# Patient Record
Sex: Female | Born: 1980 | Race: White | Hispanic: No | Smoking: Former smoker
Health system: Southern US, Community
[De-identification: ages and names within clinical notes are randomized; demographics above are authoritative.]

## PROBLEM LIST (undated history)

## (undated) DIAGNOSIS — N200 Calculus of kidney: Secondary | ICD-10-CM

## (undated) DIAGNOSIS — I341 Nonrheumatic mitral (valve) prolapse: Secondary | ICD-10-CM

## (undated) DIAGNOSIS — N879 Dysplasia of cervix uteri, unspecified: Secondary | ICD-10-CM

## (undated) DIAGNOSIS — Z87440 Personal history of urinary (tract) infections: Secondary | ICD-10-CM

## (undated) DIAGNOSIS — N159 Renal tubulo-interstitial disease, unspecified: Secondary | ICD-10-CM

## (undated) DIAGNOSIS — N12 Tubulo-interstitial nephritis, not specified as acute or chronic: Secondary | ICD-10-CM

## (undated) DIAGNOSIS — R001 Bradycardia, unspecified: Secondary | ICD-10-CM

## (undated) DIAGNOSIS — D649 Anemia, unspecified: Secondary | ICD-10-CM

## (undated) DIAGNOSIS — F32A Depression, unspecified: Secondary | ICD-10-CM

## (undated) DIAGNOSIS — Z3490 Encounter for supervision of normal pregnancy, unspecified, unspecified trimester: Secondary | ICD-10-CM

## (undated) DIAGNOSIS — I2699 Other pulmonary embolism without acute cor pulmonale: Secondary | ICD-10-CM

## (undated) DIAGNOSIS — F419 Anxiety disorder, unspecified: Secondary | ICD-10-CM

## (undated) DIAGNOSIS — R87629 Unspecified abnormal cytological findings in specimens from vagina: Secondary | ICD-10-CM

## (undated) DIAGNOSIS — R112 Nausea with vomiting, unspecified: Secondary | ICD-10-CM

## (undated) DIAGNOSIS — I10 Essential (primary) hypertension: Secondary | ICD-10-CM

## (undated) DIAGNOSIS — K219 Gastro-esophageal reflux disease without esophagitis: Secondary | ICD-10-CM

## (undated) DIAGNOSIS — Z9889 Other specified postprocedural states: Secondary | ICD-10-CM

## (undated) DIAGNOSIS — R569 Unspecified convulsions: Secondary | ICD-10-CM

## (undated) DIAGNOSIS — F329 Major depressive disorder, single episode, unspecified: Secondary | ICD-10-CM

## (undated) DIAGNOSIS — E282 Polycystic ovarian syndrome: Secondary | ICD-10-CM

## (undated) DIAGNOSIS — R51 Headache: Secondary | ICD-10-CM

## (undated) HISTORY — DX: Personal history of urinary (tract) infections: Z87.440

## (undated) HISTORY — PX: DIAGNOSTIC LAPAROSCOPY: SUR761

## (undated) HISTORY — DX: Calculus of kidney: N20.0

## (undated) HISTORY — DX: Unspecified abnormal cytological findings in specimens from vagina: R87.629

## (undated) HISTORY — DX: Encounter for supervision of normal pregnancy, unspecified, unspecified trimester: Z34.90

## (undated) HISTORY — DX: Tubulo-interstitial nephritis, not specified as acute or chronic: N12

## (undated) HISTORY — DX: Bradycardia, unspecified: R00.1

---
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
## 2004-06-17 ENCOUNTER — Emergency Department (HOSPITAL_COMMUNITY): Admission: EM | Admit: 2004-06-17 | Discharge: 2004-06-18 | Payer: Self-pay

## 2004-10-28 ENCOUNTER — Ambulatory Visit: Payer: Self-pay | Admitting: Family Medicine

## 2007-09-15 ENCOUNTER — Encounter: Payer: Self-pay | Admitting: Family Medicine

## 2010-10-14 NOTE — Letter (Signed)
Summary: Historic Patient File  Historic Patient File   Imported By: Lind Guest 06/13/2010 16:18:44  _____________________________________________________________________  External Attachment:    Type:   Image     Comment:   External Document

## 2013-05-15 DIAGNOSIS — R569 Unspecified convulsions: Secondary | ICD-10-CM

## 2013-05-15 HISTORY — DX: Unspecified convulsions: R56.9

## 2013-09-03 ENCOUNTER — Encounter (HOSPITAL_COMMUNITY): Payer: Self-pay | Admitting: Emergency Medicine

## 2013-09-03 ENCOUNTER — Emergency Department (HOSPITAL_COMMUNITY)
Admission: EM | Admit: 2013-09-03 | Discharge: 2013-09-03 | Disposition: A | Discharge status: Home / Self Care | Payer: Self-pay | Attending: Emergency Medicine | Admitting: Emergency Medicine

## 2013-09-03 ENCOUNTER — Emergency Department (HOSPITAL_COMMUNITY): Payer: Self-pay

## 2013-09-03 DIAGNOSIS — Y929 Unspecified place or not applicable: Secondary | ICD-10-CM | POA: Insufficient documentation

## 2013-09-03 DIAGNOSIS — Y939 Activity, unspecified: Secondary | ICD-10-CM | POA: Insufficient documentation

## 2013-09-03 DIAGNOSIS — F329 Major depressive disorder, single episode, unspecified: Secondary | ICD-10-CM | POA: Insufficient documentation

## 2013-09-03 DIAGNOSIS — I059 Rheumatic mitral valve disease, unspecified: Secondary | ICD-10-CM | POA: Insufficient documentation

## 2013-09-03 DIAGNOSIS — IMO0002 Reserved for concepts with insufficient information to code with codable children: Secondary | ICD-10-CM | POA: Insufficient documentation

## 2013-09-03 DIAGNOSIS — Z880 Allergy status to penicillin: Secondary | ICD-10-CM | POA: Insufficient documentation

## 2013-09-03 DIAGNOSIS — Z8669 Personal history of other diseases of the nervous system and sense organs: Secondary | ICD-10-CM | POA: Insufficient documentation

## 2013-09-03 DIAGNOSIS — F17200 Nicotine dependence, unspecified, uncomplicated: Secondary | ICD-10-CM | POA: Insufficient documentation

## 2013-09-03 DIAGNOSIS — R569 Unspecified convulsions: Secondary | ICD-10-CM | POA: Insufficient documentation

## 2013-09-03 DIAGNOSIS — S82841A Displaced bimalleolar fracture of right lower leg, initial encounter for closed fracture: Secondary | ICD-10-CM

## 2013-09-03 DIAGNOSIS — T07XXXA Unspecified multiple injuries, initial encounter: Secondary | ICD-10-CM | POA: Insufficient documentation

## 2013-09-03 DIAGNOSIS — S82843A Displaced bimalleolar fracture of unspecified lower leg, initial encounter for closed fracture: Secondary | ICD-10-CM | POA: Insufficient documentation

## 2013-09-03 DIAGNOSIS — F411 Generalized anxiety disorder: Secondary | ICD-10-CM | POA: Insufficient documentation

## 2013-09-03 DIAGNOSIS — X500XXA Overexertion from strenuous movement or load, initial encounter: Secondary | ICD-10-CM | POA: Insufficient documentation

## 2013-09-03 DIAGNOSIS — Z8742 Personal history of other diseases of the female genital tract: Secondary | ICD-10-CM | POA: Insufficient documentation

## 2013-09-03 DIAGNOSIS — D649 Anemia, unspecified: Secondary | ICD-10-CM | POA: Insufficient documentation

## 2013-09-03 DIAGNOSIS — F3289 Other specified depressive episodes: Secondary | ICD-10-CM | POA: Insufficient documentation

## 2013-09-03 DIAGNOSIS — Z87440 Personal history of urinary (tract) infections: Secondary | ICD-10-CM | POA: Insufficient documentation

## 2013-09-03 HISTORY — DX: Nonrheumatic mitral (valve) prolapse: I34.1

## 2013-09-03 MED ORDER — OXYCODONE-ACETAMINOPHEN 5-325 MG PO TABS: 2.0000 | ORAL_TABLET | ORAL | Status: DC | PRN

## 2013-09-03 MED ORDER — MORPHINE SULFATE 4 MG/ML IJ SOLN
4.0000 mg | Freq: Once | INTRAMUSCULAR | Status: AC
  Administered 2013-09-03: 4 mg via INTRAMUSCULAR
  Filled 2013-09-03: qty 1

## 2013-09-03 MED ORDER — MORPHINE SULFATE 4 MG/ML IJ SOLN
4.0000 mg | Freq: Once | INTRAMUSCULAR | Status: AC
  Administered 2013-09-03: 4 mg via INTRAVENOUS
  Filled 2013-09-03: qty 1
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
## 2013-09-03 NOTE — ED Notes (Signed)
Ortho paged. 

## 2013-09-03 NOTE — Progress Notes (Signed)
Orthopedic Tech Progress Note Patient Details:  Lauren Durham May 24, 1981 [id number]  Ortho Devices Type of Ortho Device: Stirrup splint;Post (short) splint   Haskell Flirt 09/03/2013, 2:19 AM

## 2013-09-04 ENCOUNTER — Other Ambulatory Visit (HOSPITAL_COMMUNITY): Payer: Self-pay | Admitting: Orthopaedic Surgery

## 2013-09-05 ENCOUNTER — Other Ambulatory Visit (HOSPITAL_COMMUNITY): Payer: Self-pay | Admitting: *Deleted

## 2013-09-05 ENCOUNTER — Encounter (HOSPITAL_COMMUNITY): Payer: Self-pay | Admitting: *Deleted

## 2013-09-05 NOTE — Progress Notes (Signed)
Pt states she thinks she may have injured her chest when she fell the other day. States chest is very sore between her breasts. States she did mention it to her surgeon and she stated that he said he would examine her chest when she comes in for her surgery

## 2013-09-07 MED ORDER — CLINDAMYCIN PHOSPHATE 900 MG/50ML IV SOLN
900.0000 mg | INTRAVENOUS | Status: AC
  Administered 2013-09-08: 900 mg via INTRAVENOUS
  Filled 2013-09-07 (×2): qty 50

## 2013-09-08 ENCOUNTER — Encounter (HOSPITAL_COMMUNITY): Payer: Self-pay | Admitting: *Deleted

## 2013-09-08 ENCOUNTER — Inpatient Hospital Stay (HOSPITAL_COMMUNITY): Payer: Medicaid Other | Admitting: Anesthesiology

## 2013-09-08 ENCOUNTER — Encounter (HOSPITAL_COMMUNITY)
Admission: RE | Disposition: A | Discharge status: Home / Self Care | Payer: Self-pay | Source: Ambulatory Visit | Attending: Orthopaedic Surgery

## 2013-09-08 ENCOUNTER — Inpatient Hospital Stay (HOSPITAL_COMMUNITY): Payer: Medicaid Other

## 2013-09-08 ENCOUNTER — Observation Stay (HOSPITAL_COMMUNITY)
Admission: RE | Admit: 2013-09-08 | Discharge: 2013-09-09 | Disposition: A | Discharge status: Home / Self Care | Payer: Self-pay | Source: Ambulatory Visit | Attending: Orthopaedic Surgery | Admitting: Orthopaedic Surgery

## 2013-09-08 ENCOUNTER — Encounter (HOSPITAL_COMMUNITY): Payer: Self-pay | Admitting: Anesthesiology

## 2013-09-08 DIAGNOSIS — E282 Polycystic ovarian syndrome: Secondary | ICD-10-CM | POA: Insufficient documentation

## 2013-09-08 DIAGNOSIS — R55 Syncope and collapse: Secondary | ICD-10-CM

## 2013-09-08 DIAGNOSIS — R0609 Other forms of dyspnea: Secondary | ICD-10-CM | POA: Insufficient documentation

## 2013-09-08 DIAGNOSIS — R079 Chest pain, unspecified: Secondary | ICD-10-CM | POA: Insufficient documentation

## 2013-09-08 DIAGNOSIS — W19XXXA Unspecified fall, initial encounter: Secondary | ICD-10-CM | POA: Insufficient documentation

## 2013-09-08 DIAGNOSIS — F17200 Nicotine dependence, unspecified, uncomplicated: Secondary | ICD-10-CM | POA: Insufficient documentation

## 2013-09-08 DIAGNOSIS — S82891A Other fracture of right lower leg, initial encounter for closed fracture: Secondary | ICD-10-CM

## 2013-09-08 DIAGNOSIS — I059 Rheumatic mitral valve disease, unspecified: Secondary | ICD-10-CM | POA: Insufficient documentation

## 2013-09-08 DIAGNOSIS — I341 Nonrheumatic mitral (valve) prolapse: Secondary | ICD-10-CM

## 2013-09-08 DIAGNOSIS — Z7982 Long term (current) use of aspirin: Secondary | ICD-10-CM | POA: Insufficient documentation

## 2013-09-08 DIAGNOSIS — S82843A Displaced bimalleolar fracture of unspecified lower leg, initial encounter for closed fracture: Principal | ICD-10-CM | POA: Insufficient documentation

## 2013-09-08 DIAGNOSIS — R0989 Other specified symptoms and signs involving the circulatory and respiratory systems: Secondary | ICD-10-CM | POA: Insufficient documentation

## 2013-09-08 HISTORY — DX: Polycystic ovarian syndrome: E28.2

## 2013-09-08 HISTORY — PX: ORIF ANKLE FRACTURE: SHX5408

## 2013-09-08 HISTORY — DX: Unspecified convulsions: R56.9

## 2013-09-08 HISTORY — DX: Anxiety disorder, unspecified: F41.9

## 2013-09-08 HISTORY — DX: Headache: R51

## 2013-09-08 HISTORY — DX: Renal tubulo-interstitial disease, unspecified: N15.9

## 2013-09-08 HISTORY — DX: Anemia, unspecified: D64.9

## 2013-09-08 HISTORY — DX: Dysplasia of cervix uteri, unspecified: N87.9

## 2013-09-08 HISTORY — DX: Depression, unspecified: F32.A

## 2013-09-08 HISTORY — DX: Major depressive disorder, single episode, unspecified: F32.9

## 2013-09-08 LAB — CBC
HCT: 36 % (ref 36.0–46.0)
MCH: 32.6 pg (ref 26.0–34.0)
MCHC: 34.2 g/dL (ref 30.0–36.0)
MCV: 95.5 fL (ref 78.0–100.0)
RDW: 12.6 % (ref 11.5–15.5)

## 2013-09-08 SURGERY — OPEN REDUCTION INTERNAL FIXATION (ORIF) ANKLE FRACTURE
Anesthesia: General | Site: Ankle | Laterality: Right

## 2013-09-08 MED ORDER — SENNA 8.6 MG PO TABS
1.0000 | ORAL_TABLET | Freq: Two times a day (BID) | ORAL | Status: DC
  Administered 2013-09-09: 8.6 mg via ORAL
  Filled 2013-09-08 (×4): qty 1

## 2013-09-08 MED ORDER — CLINDAMYCIN PHOSPHATE 600 MG/50ML IV SOLN
600.0000 mg | Freq: Four times a day (QID) | INTRAVENOUS | Status: AC
  Administered 2013-09-08 – 2013-09-09 (×3): 600 mg via INTRAVENOUS
  Filled 2013-09-08 (×4): qty 50

## 2013-09-08 MED ORDER — HYDROMORPHONE HCL PF 1 MG/ML IJ SOLN
0.5000 mg | INTRAMUSCULAR | Status: DC | PRN
  Administered 2013-09-08 (×3): 0.5 mg via INTRAVENOUS

## 2013-09-08 MED ORDER — ASPIRIN EC 325 MG PO TBEC: 325.0000 mg | DELAYED_RELEASE_TABLET | Freq: Two times a day (BID) | ORAL | Status: DC

## 2013-09-08 MED ORDER — METOCLOPRAMIDE HCL 5 MG/ML IJ SOLN: 5.0000 mg | Freq: Three times a day (TID) | INTRAMUSCULAR | Status: DC | PRN

## 2013-09-08 MED ORDER — LACTATED RINGERS IV SOLN
INTRAVENOUS | Status: DC
  Administered 2013-09-08: 09:00:00 via INTRAVENOUS

## 2013-09-08 MED ORDER — PROPOFOL 10 MG/ML IV BOLUS
INTRAVENOUS | Status: DC | PRN
  Administered 2013-09-08: 180 mg via INTRAVENOUS

## 2013-09-08 MED ORDER — SODIUM CHLORIDE 0.9 % IV SOLN: INTRAVENOUS | Status: DC

## 2013-09-08 MED ORDER — KETOROLAC TROMETHAMINE 30 MG/ML IJ SOLN
15.0000 mg | Freq: Once | INTRAMUSCULAR | Status: AC | PRN
  Administered 2013-09-08: 30 mg via INTRAVENOUS

## 2013-09-08 MED ORDER — 0.9 % SODIUM CHLORIDE (POUR BTL) OPTIME
TOPICAL | Status: DC | PRN
  Administered 2013-09-08: 1000 mL

## 2013-09-08 MED ORDER — ONDANSETRON HCL 4 MG/2ML IJ SOLN
INTRAMUSCULAR | Status: DC | PRN
  Administered 2013-09-08: 4 mg via INTRAVENOUS

## 2013-09-08 MED ORDER — MIDAZOLAM HCL 2 MG/2ML IJ SOLN
INTRAMUSCULAR | Status: AC
  Administered 2013-09-08: 2 mg
  Filled 2013-09-08: qty 2

## 2013-09-08 MED ORDER — METHOCARBAMOL 500 MG PO TABS
ORAL_TABLET | ORAL | Status: AC
  Filled 2013-09-08: qty 1

## 2013-09-08 MED ORDER — ASPIRIN EC 325 MG PO TBEC
325.0000 mg | DELAYED_RELEASE_TABLET | Freq: Two times a day (BID) | ORAL | Status: DC
  Administered 2013-09-08 – 2013-09-09 (×2): 325 mg via ORAL
  Filled 2013-09-08 (×4): qty 1

## 2013-09-08 MED ORDER — ONDANSETRON HCL 4 MG/2ML IJ SOLN
4.0000 mg | Freq: Four times a day (QID) | INTRAMUSCULAR | Status: DC | PRN
  Administered 2013-09-08: 4 mg via INTRAVENOUS
  Filled 2013-09-08 (×2): qty 2

## 2013-09-08 MED ORDER — MAGNESIUM CITRATE PO SOLN: 1.0000 | Freq: Once | ORAL | Status: AC | PRN

## 2013-09-08 MED ORDER — HYDROMORPHONE HCL PF 1 MG/ML IJ SOLN
INTRAMUSCULAR | Status: AC
  Filled 2013-09-08: qty 1

## 2013-09-08 MED ORDER — HYDROMORPHONE HCL PF 1 MG/ML IJ SOLN
INTRAMUSCULAR | Status: AC
  Administered 2013-09-08: 0.5 mg
  Filled 2013-09-08: qty 1

## 2013-09-08 MED ORDER — OXYCODONE HCL 5 MG PO TABS
5.0000 mg | ORAL_TABLET | ORAL | Status: DC | PRN
  Administered 2013-09-08: 15 mg via ORAL
  Administered 2013-09-08: 10 mg via ORAL
  Administered 2013-09-09 (×3): 15 mg via ORAL
  Administered 2013-09-09: 10 mg via ORAL
  Filled 2013-09-08 (×3): qty 3
  Filled 2013-09-08: qty 2
  Filled 2013-09-08: qty 3
  Filled 2013-09-08: qty 2

## 2013-09-08 MED ORDER — HYDROCODONE-ACETAMINOPHEN 5-325 MG PO TABS
1.0000 | ORAL_TABLET | ORAL | Status: DC | PRN
  Administered 2013-09-08 – 2013-09-09 (×5): 2 via ORAL
  Filled 2013-09-08 (×5): qty 2

## 2013-09-08 MED ORDER — ONDANSETRON HCL 4 MG/2ML IJ SOLN: 4.0000 mg | Freq: Once | INTRAMUSCULAR | Status: DC | PRN

## 2013-09-08 MED ORDER — POLYETHYLENE GLYCOL 3350 17 G PO PACK: 17.0000 g | PACK | Freq: Every day | ORAL | Status: DC | PRN

## 2013-09-08 MED ORDER — FENTANYL CITRATE 0.05 MG/ML IJ SOLN
INTRAMUSCULAR | Status: AC
  Filled 2013-09-08: qty 2

## 2013-09-08 MED ORDER — KETOROLAC TROMETHAMINE 30 MG/ML IJ SOLN
INTRAMUSCULAR | Status: AC
  Filled 2013-09-08: qty 1

## 2013-09-08 MED ORDER — OXYCODONE HCL 5 MG PO TABS
ORAL_TABLET | ORAL | Status: AC
  Filled 2013-09-08: qty 1

## 2013-09-08 MED ORDER — OXYCODONE HCL 5 MG/5ML PO SOLN: 5.0000 mg | Freq: Once | ORAL | Status: AC | PRN

## 2013-09-08 MED ORDER — LACTATED RINGERS IV SOLN
INTRAVENOUS | Status: DC | PRN
  Administered 2013-09-08 (×2): via INTRAVENOUS

## 2013-09-08 MED ORDER — SORBITOL 70 % SOLN: 30.0000 mL | Freq: Every day | Status: DC | PRN

## 2013-09-08 MED ORDER — FENTANYL CITRATE 0.05 MG/ML IJ SOLN
100.0000 ug | Freq: Once | INTRAMUSCULAR | Status: AC
  Administered 2013-09-08: 100 ug via INTRAVENOUS

## 2013-09-08 MED ORDER — MORPHINE SULFATE 2 MG/ML IJ SOLN
1.0000 mg | INTRAMUSCULAR | Status: DC | PRN
  Administered 2013-09-09: 1 mg via INTRAVENOUS
  Filled 2013-09-08: qty 1

## 2013-09-08 MED ORDER — ONDANSETRON HCL 4 MG PO TABS
4.0000 mg | ORAL_TABLET | Freq: Four times a day (QID) | ORAL | Status: DC | PRN
  Administered 2013-09-09: 4 mg via ORAL
  Filled 2013-09-08: qty 1

## 2013-09-08 MED ORDER — NICOTINE 14 MG/24HR TD PT24
14.0000 mg | MEDICATED_PATCH | TRANSDERMAL | Status: DC
  Administered 2013-09-08: 14 mg via TRANSDERMAL
  Filled 2013-09-08 (×2): qty 1

## 2013-09-08 MED ORDER — FENTANYL CITRATE 0.05 MG/ML IJ SOLN: 25.0000 ug | INTRAMUSCULAR | Status: DC | PRN

## 2013-09-08 MED ORDER — METOCLOPRAMIDE HCL 10 MG PO TABS: 5.0000 mg | ORAL_TABLET | Freq: Three times a day (TID) | ORAL | Status: DC | PRN

## 2013-09-08 MED ORDER — METHOCARBAMOL 100 MG/ML IJ SOLN
500.0000 mg | Freq: Four times a day (QID) | INTRAVENOUS | Status: DC | PRN
  Filled 2013-09-08: qty 5

## 2013-09-08 MED ORDER — OXYCODONE HCL 5 MG PO TABS
5.0000 mg | ORAL_TABLET | Freq: Once | ORAL | Status: AC | PRN
  Administered 2013-09-08: 5 mg via ORAL

## 2013-09-08 MED ORDER — FENTANYL CITRATE 0.05 MG/ML IJ SOLN
INTRAMUSCULAR | Status: DC | PRN
  Administered 2013-09-08: 25 ug via INTRAVENOUS
  Administered 2013-09-08: 50 ug via INTRAVENOUS
  Administered 2013-09-08 (×2): 25 ug via INTRAVENOUS
  Administered 2013-09-08 (×2): 50 ug via INTRAVENOUS
  Administered 2013-09-08: 25 ug via INTRAVENOUS

## 2013-09-08 MED ORDER — LIDOCAINE HCL (CARDIAC) 20 MG/ML IV SOLN
INTRAVENOUS | Status: DC | PRN
  Administered 2013-09-08: 40 mg via INTRAVENOUS

## 2013-09-08 MED ORDER — MIDAZOLAM HCL 2 MG/2ML IJ SOLN: 2.0000 mg | Freq: Once | INTRAMUSCULAR | Status: DC

## 2013-09-08 MED ORDER — METHOCARBAMOL 500 MG PO TABS
500.0000 mg | ORAL_TABLET | Freq: Four times a day (QID) | ORAL | Status: DC | PRN
  Administered 2013-09-08 – 2013-09-09 (×4): 500 mg via ORAL
  Filled 2013-09-08 (×3): qty 1

## 2013-09-08 MED ORDER — DIPHENHYDRAMINE HCL 12.5 MG/5ML PO ELIX: 25.0000 mg | ORAL_SOLUTION | ORAL | Status: DC | PRN

## 2013-09-08 MED ORDER — OXYCODONE HCL 5 MG PO TABS: 5.0000 mg | ORAL_TABLET | ORAL | Status: DC | PRN

## 2013-09-08 SURGICAL SUPPLY — 63 items
1.3 K WIRES ×2 IMPLANT
BANDAGE ELASTIC 4 VELCRO ST LF (GAUZE/BANDAGES/DRESSINGS) ×2 IMPLANT
BANDAGE ELASTIC 6 VELCRO ST LF (GAUZE/BANDAGES/DRESSINGS) ×1 IMPLANT
BIT DRILL 2.7 QC CANN 155 (BIT) ×2 IMPLANT
BIT DRILL QC 2.7 6.3IN  SHORT (BIT) ×1
BIT DRILL QC 2.7 6.3IN SHORT (BIT) IMPLANT
BNDG COHESIVE 4X5 TAN STRL (GAUZE/BANDAGES/DRESSINGS) ×2 IMPLANT
BNDG COHESIVE 6X5 TAN STRL LF (GAUZE/BANDAGES/DRESSINGS) ×2 IMPLANT
CLOTH BEACON ORANGE TIMEOUT ST (SAFETY) ×1 IMPLANT
COVER SURGICAL LIGHT HANDLE (MISCELLANEOUS) ×2 IMPLANT
CUFF TOURNIQUET SINGLE 34IN LL (TOURNIQUET CUFF) ×1 IMPLANT
CUFF TOURNIQUET SINGLE 44IN (TOURNIQUET CUFF) IMPLANT
DRAPE C-ARM 42X72 X-RAY (DRAPES) ×2 IMPLANT
DRAPE C-ARMOR (DRAPES) IMPLANT
DRAPE INCISE IOBAN 66X45 STRL (DRAPES) ×2 IMPLANT
DRAPE U-SHAPE 47X51 STRL (DRAPES) ×3 IMPLANT
DURAPREP 26ML APPLICATOR (WOUND CARE) ×3 IMPLANT
ELECT CAUTERY BLADE 6.4 (BLADE) ×2 IMPLANT
ELECT REM PT RETURN 9FT ADLT (ELECTROSURGICAL) ×2
ELECTRODE REM PT RTRN 9FT ADLT (ELECTROSURGICAL) ×1 IMPLANT
GAUZE XEROFORM 5X9 LF (GAUZE/BANDAGES/DRESSINGS) ×2 IMPLANT
GLOVE SURG SS PI 7.5 STRL IVOR (GLOVE) ×4 IMPLANT
GOWN STRL NON-REIN LRG LVL3 (GOWN DISPOSABLE) ×2 IMPLANT
GOWN STRL REIN XL XLG (GOWN DISPOSABLE) ×3 IMPLANT
KIT BASIN OR (CUSTOM PROCEDURE TRAY) ×2 IMPLANT
KIT ROOM TURNOVER OR (KITS) ×2 IMPLANT
NDL HYPO 25GX1X1/2 BEV (NEEDLE) ×1 IMPLANT
NEEDLE HYPO 25GX1X1/2 BEV (NEEDLE) IMPLANT
NS IRRIG 1000ML POUR BTL (IV SOLUTION) ×2 IMPLANT
PACK ORTHO EXTREMITY (CUSTOM PROCEDURE TRAY) ×2 IMPLANT
PAD ABD 8X10 STRL (GAUZE/BANDAGES/DRESSINGS) ×1 IMPLANT
PAD ARMBOARD 7.5X6 YLW CONV (MISCELLANEOUS) ×4 IMPLANT
PAD CAST 3X4 CTTN HI CHSV (CAST SUPPLIES) ×2 IMPLANT
PADDING CAST COTTON 3X4 STRL (CAST SUPPLIES)
PADDING CAST COTTON 6X4 STRL (CAST SUPPLIES) ×1 IMPLANT
PADDING CAST SYN 6 (CAST SUPPLIES) ×1
PADDING CAST SYNTHETIC 4 (CAST SUPPLIES) ×1
PADDING CAST SYNTHETIC 4X4 STR (CAST SUPPLIES) ×1 IMPLANT
PADDING CAST SYNTHETIC 6X4 NS (CAST SUPPLIES) IMPLANT
PLATE FIBULA 5 HOLE (Plate) ×2 IMPLANT
SCREW CANC 5.0X14 (Screw) ×2 IMPLANT
SCREW CANNULATED 4.0X35 (Screw) ×1 IMPLANT
SCREW CANNULATED 4.0X36 (Screw) ×1 IMPLANT
SCREW LOCK 10X3.5XST NS (Screw) IMPLANT
SCREW LOCK 3.5X10 (Screw) ×2 IMPLANT
SCREW NL 3.5X14 (Screw) ×1 IMPLANT
SCREW NON LOCK 3.5X12 (Screw) ×2 IMPLANT
SCREW NONLOCK 3.5X10 (Screw) ×2 IMPLANT
SCREW NONLOCK 3.5X18 (Screw) ×2 IMPLANT
SPONGE GAUZE 4X4 12PLY (GAUZE/BANDAGES/DRESSINGS) ×2 IMPLANT
SPONGE LAP 18X18 X RAY DECT (DISPOSABLE) ×2 IMPLANT
SUCTION FRAZIER TIP 10 FR DISP (SUCTIONS) ×2 IMPLANT
SUT ETHILON 2 0 FS 18 (SUTURE) IMPLANT
SUT ETHILON 3 0 PS 1 (SUTURE) ×2 IMPLANT
SUT VIC AB 0 CT1 27 (SUTURE) ×2
SUT VIC AB 0 CT1 27XBRD ANBCTR (SUTURE) IMPLANT
SUT VIC AB 2-0 CT1 27 (SUTURE) ×2
SUT VIC AB 2-0 CT1 TAPERPNT 27 (SUTURE) IMPLANT
SYR CONTROL 10ML LL (SYRINGE) IMPLANT
TOWEL OR 17X24 6PK STRL BLUE (TOWEL DISPOSABLE) ×3 IMPLANT
TOWEL OR 17X26 10 PK STRL BLUE (TOWEL DISPOSABLE) ×2 IMPLANT
TUBE CONNECTING 12X1/4 (SUCTIONS) ×2 IMPLANT
WATER STERILE IRR 1000ML POUR (IV SOLUTION) ×1 IMPLANT

## 2013-09-08 NOTE — Anesthesia Preprocedure Evaluation (Signed)
Anesthesia Evaluation  Patient identified by MRN, date of birth, ID band Patient awake    Reviewed: Allergy & Precautions, H&P , NPO status , Patient's Chart, lab work & pertinent test results  Airway Mallampati: II TM Distance: >3 FB Neck ROM: Full    Dental  (+) Teeth Intact and Dental Advisory Given   Pulmonary Current Smoker,  breath sounds clear to auscultation        Cardiovascular Rhythm:Regular Rate:Normal     Neuro/Psych    GI/Hepatic   Endo/Other    Renal/GU      Musculoskeletal   Abdominal   Peds  Hematology   Anesthesia Other Findings   Reproductive/Obstetrics                           Anesthesia Physical Anesthesia Plan  ASA: II  Anesthesia Plan: General   Post-op Pain Management:    Induction: Intravenous  Airway Management Planned: LMA  Additional Equipment:   Intra-op Plan:   Post-operative Plan:   Informed Consent: I have reviewed the patients History and Physical, chart, labs and discussed the procedure including the risks, benefits and alternatives for the proposed anesthesia with the patient or authorized representative who has indicated his/her understanding and acceptance.   Dental advisory given  Plan Discussed with: CRNA and Anesthesiologist  Anesthesia Plan Comments:         Anesthesia Quick Evaluation
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
## 2013-09-08 NOTE — H&P (Signed)
H&P update  The surgical history has been reviewed and remains accurate without interval change.  The patient was re-examined and patient's physiologic condition has not changed significantly in the last 30 days. The condition still exists that makes this procedure necessary. The treatment plan remains the same, without new options for care.  No new pharmacological allergies or types of therapy has been initiated that would change the plan or the appropriateness of the plan.  The patient and/or family understand the potential benefits and risks.  Mayra Reel, MD 09/08/2013 9:50 AM

## 2013-09-08 NOTE — Progress Notes (Signed)
Orthopedic Tech Progress Note Patient Details:  Lauren Durham 1980/12/07 [id number]  Patient ID: Lauren Durham, female   DOB: January 10, 1981, 32 y.o.   MRN: [id number] Trapeze bar patient helper  Lauren Durham 09/08/2013, 3:34 PM

## 2013-09-08 NOTE — Transfer of Care (Signed)
Immediate Anesthesia Transfer of Care Note  Patient: Lauren Durham  Procedure(s) Performed: Procedure(s): OPEN REDUCTION INTERNAL FIXATION (ORIF) RIGHT ANKLE FRACTURE (Right)  Patient Location: PACU  Anesthesia Type:General  Level of Consciousness: awake, alert  and oriented  Airway & Oxygen Therapy: Patient Spontanous Breathing  Post-op Assessment: Report given to PACU RN  Post vital signs: Reviewed and stable  Complications: No apparent anesthesia complications

## 2013-09-08 NOTE — Progress Notes (Signed)
Pt states that her chest is sore. Order placed for CXR.

## 2013-09-08 NOTE — Preoperative (Signed)
Beta Blockers   Reason not to administer Beta Blockers:Not Applicable 

## 2013-09-08 NOTE — Consult Note (Signed)
Patient seen, examined. Available data reviewed. Agree with findings, assessment, and plan as outlined by Thereasa Parkin, PA-C. This is an alert and oriented woman in no acute distress. Her exam reveals clear lung fields, no carotid bruits, normal jugular venous pressure, heart sounds the heart regular rate and rhythm with a soft grade 1/6 ejection murmur at the upper sternal border. There is an intermittent midsystolic click. There is no peripheral edema. Considering her multitude of symptoms including episodes of frank syncope, exertional dyspnea and palpitations, I think it is reasonable to check a surface echocardiogram. Her exam is not suggestive of significant mitral regurgitation. As long as her echo is unrevealing, I would not foresee any further cardiac testing. There is no reason to do serial echocardiography for mitral valve prolapse unless she has a change in symptoms or exam. As long as her echo is ok, she can followup as needed.  Tonny Bollman, M.D. 09/08/2013 4:28 PM

## 2013-09-08 NOTE — Anesthesia Procedure Notes (Signed)
Anesthesia Regional Block:  Popliteal block  Pre-Anesthetic Checklist: ,, timeout performed, Correct Patient, Correct Site, Correct Laterality, Correct Procedure, Correct Position, site marked, Risks and benefits discussed,  Surgical consent,  Pre-op evaluation,  At surgeon's request and post-op pain management  Laterality: Right  Prep: chloraprep       Needles:  Injection technique: Single-shot  Needle Type: Echogenic Stimulator Needle     Needle Length:cm 9 cm Needle Gauge: 22 and 22 G    Additional Needles:  Procedures: nerve stimulator Popliteal block Narrative:  Start time: 09/08/2013 9:10 AM End time: 09/08/2013 9:15 AM Injection made incrementally with aspirations every 5 mL.  Performed by: Personally   Additional Notes: 30 cc 0.5% marcaine 1:200 Epi  8 mg decadron injected easily

## 2013-09-08 NOTE — Anesthesia Postprocedure Evaluation (Signed)
  Anesthesia Post-op Note  Patient: Lauren Durham  Procedure(s) Performed: Procedure(s): OPEN REDUCTION INTERNAL FIXATION (ORIF) RIGHT ANKLE FRACTURE (Right)  Patient Location: PACU  Anesthesia Type:General and GA combined with regional for post-op pain  Level of Consciousness: awake, alert  and oriented  Airway and Oxygen Therapy: Patient Spontanous Breathing  Post-op Pain: mild  Post-op Assessment: Post-op Vital signs reviewed, Patient's Cardiovascular Status Stable, Respiratory Function Stable, Patent Airway and Pain level controlled  Post-op Vital Signs: stable  Complications: No apparent anesthesia complications

## 2013-09-08 NOTE — Op Note (Signed)
Date of surgery: 09/08/2013   Preoperative diagnosis: Right ankle bimalleolar fracture   Postoperative diagnosis: Same   Procedure: Open treatment of right bimalleolar ankle fracture with internal fixation.   Implants: [employer] distal fibula plate  Surgeon: Glee Arvin, M.D.   Anesthesia General and regional  Estimated blood loss: Minimal   Complications: None   Condition to PACU: Stable   Indication for procedure: The patient is a 32 year old female who sustained the above mentioned condition and presented here for surgical treatment. The risks, benefits, and alternatives to surgery were again discussed with the patient and he elected to proceed with surgery.   Description of procedure. Patient was identified in the preoperative holding area. The operative site and procedure were confirmed with the patient and marked by the surgeon. He is brought back to the operating room. His placed supine on the table. General anesthesia was induced. A nonsterile tourniquet was placed on the upper right thigh. The right lower extremity was prepped and draped in standard sterile fashion. A pre-incisional antibiotics were given. A timeout was performed. We first began with the lateral malleolus fracture. A laterally-based incision was used. Blunt dissection was taken down to the level of the fascia. The periosteum and fascial sharp sharply incised off of the bone. The superficial peroneal nerve was not encountered. The fracture site was exposed. The periosteum from the fracture site and hematoma were excised from the fracture site. The fracture was reduced using manual manipulation and a tenaculum clamp. X-rays were taken to confirm adequate reduction. A lag screw could not be placed given the horizontal nature of the fracture pattern. We then placed a 5-hole distal fibular plate on the lateral aspect of the fibula.  Screws were sequentially placed. Once this was done we then moved to the medial aspect  of the ankle. A curvilinear longitudinal incision was used based over the medial malleolus. The saphenous vein was encountered and retracted and protected during the procedure. The periosteum was sharply elevated off of the bone and the fracture site. There was a large amount of periosteum those and trapped in the fracture site. This was taken out of fracture site. The ankle joint was visualized through the fracture site and did not appear to have any chondral damage. We then irrigated large amount of normal saline through the fracture site and the joint to washout any loose debris. We then obtained reduction of the fracture using a dental pick. We then placed 2 parallel pins up the medial malleolus and into the distal tibia. X-rays were taken to confirm adequate placement. 2 cannulated screws were placed up the pins. The first cannulated screw was partially threaded to achieve compression across the fracture site and then the second cannulated screw was fully threaded to hold the fracture in place. The pins were then taken out. Final x-rays were taken to confirm adequate reduction and hardware placement. The wounds were irrigated with normal saline using bulb syringe. The wounds were closed in layer fashion using 0 Vicryl for the fascia 2-0 Vicryl for the subcutaneous layer and 3-0 nylon for the skin. A sterile dressing was placed and a short-leg splint was placed patient afterwards. The patient awoke from anesthesia and was transferred to the PACU in stable condition.   Disposition: The patient will be nonweightbearing to the right lower extremity. He will be discharged home. We'll see him back in the office in 2 weeks. I will put him on aspirin 325 twice a day for DVT prophylaxis.  Mayra Reel, MD [facility] [phone] 12:01 PM

## 2013-09-09 DIAGNOSIS — R0989 Other specified symptoms and signs involving the circulatory and respiratory systems: Secondary | ICD-10-CM

## 2013-09-09 DIAGNOSIS — R0609 Other forms of dyspnea: Secondary | ICD-10-CM

## 2013-09-09 LAB — COMPREHENSIVE METABOLIC PANEL
ALT: 7 U/L (ref 0–35)
AST: 10 U/L (ref 0–37)
Albumin: 2.8 g/dL — ABNORMAL LOW (ref 3.5–5.2)
CO2: 26 mEq/L (ref 19–32)
Calcium: 8.4 mg/dL (ref 8.4–10.5)
Potassium: 4.2 mEq/L (ref 3.5–5.1)
Sodium: 138 mEq/L (ref 135–145)
Total Protein: 5.8 g/dL — ABNORMAL LOW (ref 6.0–8.3)

## 2013-09-09 NOTE — Progress Notes (Signed)
   Subjective:  Patient reports pain as moderate.  No events  Objective:   VITALS:   Filed Vitals:   09/08/13 1700 09/08/13 2116 09/09/13 0203 09/09/13 0533  BP: 111/71 120/76 114/62 109/71  Pulse: 90 94 83 81  Temp:  97.9 F (36.6 C) 98.1 F (36.7 C) 98.3 F (36.8 C)  TempSrc:  Oral Oral Oral  Resp: 18 18 18 18   SpO2: 96% 97% 98% 98%    Neurologically intact Neurovascular intact Sensation intact distally Intact pulses distally Dorsiflexion/Plantar flexion intact Incision: dressing C/D/I and no drainage No cellulitis present Compartment soft   Lab Results  Component Value Date   WBC 8.1 09/08/2013   HGB 12.3 09/08/2013   HCT 36.0 09/08/2013   MCV 95.5 09/08/2013   PLT 147* 09/08/2013     Assessment/Plan: 1 Day Post-Op   Problem List Items Addressed This Visit     Cardiovascular and Mediastinum   Syncope   Relevant Medications      aspirin EC tablet 325 mg      aspirin EC tablet   Mitral valve prolapse     Musculoskeletal and Integument   *Ankle fracture, right - Primary   Relevant Orders      Non weight bearing      Advance diet Up with therapy Up with PT/OT DVT ppx - SCDs, ambulation, asa  NWB right and lower extremity Pain control Discharge pending PT and echo results F/u 2 weeks   Cheral Almas 09/09/2013, 10:16 AM [phone]

## 2013-09-09 NOTE — Progress Notes (Signed)
OT Cancellation Note  Patient Details Name: Lauren Durham MRN: [id number] DOB: [birth year]   Cancelled Treatment:    Reason Eval/Treat Not Completed: OT screened, no needs identified, will sign off.  09/09/2013 Cipriano Mile OTR/L Pager [phone] Office [phone]

## 2013-09-09 NOTE — Progress Notes (Signed)
Utilization Review Completed.  

## 2013-09-09 NOTE — Evaluation (Signed)
Physical Therapy Evaluation Patient Details Name: Lauren Durham MRN: [id number] DOB: Aug 15, 1981 Today's Date: 09/09/2013 Time: 4098-1191 PT Time Calculation (min): 15 min  PT Assessment / Plan / Recommendation History of Present Illness  Pt sustained R ankle fx approx 6 days ago.  She underwent ORIF yesterday, 09-08-13.  Clinical Impression  Patient is s/p ORIF R ankle surgery resulting in functional limitations due to the deficits listed below (see PT Problem List). Pt was issued crutches in ED at time of injury.  She has used the crutches to ambulate NWB RLE for the past week.  She states that she prefers to just have her father and boyfriend assist her up stairs instead of using crutches. Patient will benefit from skilled PT to increase their independence and safety with mobility to allow discharge to the venue listed below.  She will need a RW for home use to provide more stability with ambulation initially.      PT Assessment  Patient needs continued PT services    Follow Up Recommendations  No PT follow up    Does the patient have the potential to tolerate intense rehabilitation      Barriers to Discharge        Equipment Recommendations  Rolling walker with 5" wheels    Recommendations for Other Services     Frequency Min 6X/week    Precautions / Restrictions Restrictions Weight Bearing Restrictions: Yes RLE Weight Bearing: Non weight bearing   Pertinent Vitals/Pain 7/10      Mobility  Bed Mobility Bed Mobility: Supine to Sit;Sit to Supine Supine to Sit: 6: Modified independent (Device/Increase time) Sit to Supine: 6: Modified independent (Device/Increase time) Transfers Transfers: Sit to Stand;Stand to Sit Sit to Stand: 4: Min guard;From bed;From toilet;With upper extremity assist Stand to Sit: 4: Min guard;To bed;To toilet;With upper extremity assist Details for Transfer Assistance: verbal cues for hand placement Ambulation/Gait Ambulation/Gait Assistance:  4: Min guard Ambulation Distance (Feet): 15 Feet Assistive device: Rolling walker Gait Pattern: Step-to pattern Gait velocity: decreased Stairs:  (Pt prefers to have her father/boyfriend assist up stairs.)    Exercises     PT Diagnosis: Difficulty walking;Acute pain  PT Problem List: Decreased activity tolerance;Decreased mobility;Decreased knowledge of precautions;Decreased knowledge of use of DME;Pain PT Treatment Interventions: DME instruction;Gait training;Stair training;Functional mobility training;Therapeutic activities;Patient/family education     PT Goals(Current goals can be found in the care plan section) Acute Rehab PT Goals Patient Stated Goal: home PT Goal Formulation: With patient Time For Goal Achievement: 09/16/13 Potential to Achieve Goals: Good  Visit Information  Last PT Received On: 09/09/13 Assistance Needed: +1 History of Present Illness: Pt sustained R ankle fx approx 6 days ago.  She underwent ORIF yesterday, 09-08-13.       Prior Functioning  Home Living Family/patient expects to be discharged to:: Private residence Living Arrangements: Spouse/significant other;Parent Available Help at Discharge: Family;Available 24 hours/day Type of Home: House Home Access: Stairs to enter [employer] of Steps: 6 Home Layout: One level Home Equipment: Crutches Prior Function Level of Independence: Independent Communication Communication: No difficulties    Cognition  Cognition Arousal/Alertness: Awake/alert Behavior During Therapy: WFL for tasks assessed/performed Overall Cognitive Status: Within Functional Limits for tasks assessed    Extremity/Trunk Assessment     Balance    End of Session PT - End of Session Equipment Utilized During Treatment: Gait belt Activity Tolerance: Patient limited by pain Patient left: in bed;with call bell/phone within reach Nurse Communication: Mobility status;Patient requests pain meds  GP Functional  Assessment Tool Used: clinical judgement Functional Limitation: Mobility: Walking and moving around Mobility: Walking and Moving Around Current Status [phone]): At least 1 percent but less than 20 percent impaired, limited or restricted Mobility: Walking and Moving Around Goal Status [phone]): 0 percent impaired, limited or restricted   Ilda Foil 09/09/2013, 11:42 AM  Aida Raider, PT  Office # [phone] Pager [phone]

## 2013-09-09 NOTE — Progress Notes (Signed)
      PROGRESS NOTE  Subjective:   32 yo with hx of MVP - had surgery for ankle fracture yesterday.  Doing well from a cardiac standpoint  Objective:    Vital Signs:   Temp:  [97.9 F (36.6 C)-98.3 F (36.8 C)] 98.3 F (36.8 C) (12/27 0533) Pulse Rate:  [55-106] 81 (12/27 0533) Resp:  [7-22] 18 (12/27 0533) BP: (84-120)/(49-84) 109/71 mmHg (12/27 0533) SpO2:  [95 %-100 %] 98 % (12/27 0533)      24-hour weight change: Weight change:   Weight trends: There were no vitals filed for this visit.  Intake/Output:  12/26 0701 - 12/27 0700 In: 1720 [P.O.:420; I.V.:1300] Out: 375 [Urine:375]     Physical Exam: BP 109/71  Pulse 81  Temp(Src) 98.3 F (36.8 C) (Oral)  Resp 18  SpO2 98%  LMP 08/19/2013  General: Vital signs reviewed and noted.   Head: Normocephalic, atraumatic.  Eyes: conjunctivae/corneas clear.  EOM's intact.   Throat: normal  Neck:  normal  Lungs:    clear  Heart:  RR, no significant murmur  Abdomen:  Soft, non-tender, non-distended    Extremities: No edema   Neurologic: A&O X3, CN II - XII are grossly intact.   Psych: Normal     Labs: BMET:  Recent Labs  09/09/13 0500  NA 138  K 4.2  CL 105  CO2 26  GLUCOSE 124*  BUN 11  CREATININE 0.58  CALCIUM 8.4    Liver function tests:  Recent Labs  09/09/13 0500  AST 10  ALT 7  ALKPHOS 58  BILITOT 0.1*  PROT 5.8*  ALBUMIN 2.8*   No results found for this basename: LIPASE, AMYLASE,  in the last 72 hours  CBC:  Recent Labs  09/08/13 0758  WBC 8.1  HGB 12.3  HCT 36.0  MCV 95.5  PLT 147*    Cardiac Enzymes: No results found for this basename: CKTOTAL, CKMB, TROPONINI,  in the last 72 hours  Coagulation Studies: No results found for this basename: LABPROT, INR,  in the last 72 hours  Other: No components found with this basename: POCBNP,  No results found for this basename: DDIMER,  in the last 72 hours No results found for this basename: HGBA1C,  in the last 72  hours No results found for this basename: CHOL, HDL, LDLCALC, TRIG, CHOLHDL,  in the last 72 hours No results found for this basename: TSH, T4TOTAL, FREET3, T3FREE, THYROIDAB,  in the last 72 hours No results found for this basename: VITAMINB12, FOLATE, FERRITIN, TIBC, IRON, RETICCTPCT,  in the last 72 hours   Other results:  Not on tele  Medications:    Infusions: . sodium chloride 125 mL/hr (09/08/13 1400)  . lactated ringers 10 mL/hr at 09/08/13 4782    Scheduled Medications: . aspirin EC  325 mg Oral BID  . midazolam  2 mg Intravenous Once  . nicotine  14 mg Transdermal Q24H  . senna  1 tablet Oral BID    Assessment/ Plan:   Principal Problem:   Ankle fracture, right Active Problems:   Closed right ankle fracture   Syncope   Mitral valve prolapse  1. MVP:  Stable.  No further follow up needed at this point.  She may follow up with [place] as OP as needed.  Will sign off.  Call for questions.  Disposition:  Length of Stay: 1  Vesta Mixer, Montez Hageman., MD, [facility] 09/09/2013, 8:14 AM Office [phone] Pager [phone]

## 2013-09-09 NOTE — Discharge Summary (Signed)
Physician Discharge Summary  Patient ID: Lauren Durham MRN: [id number] DOB/AGE: September 15, 1980 32 y.o.  Admit date: 09/08/2013 Discharge date: 09/09/2013  Admission Diagnoses:  Ankle fracture, right  Discharge Diagnoses:  Principal Problem:   Ankle fracture, right Active Problems:   Closed right ankle fracture   Syncope   Mitral valve prolapse   Past Medical History  Diagnosis Date  . MVP (mitral valve prolapse)   . Seizures 05/2013    potassium was low, blood sugar was low  . Depression     not treated at present time  . Anxiety     not treated at present time  . Kidney infection     has had several  . Headache(784.0)     gets headache with menstrual period  . Cervical dysplasia   . Anemia     "low iron", anemic as a child  . Polycystic ovary syndrome     Surgeries: Procedure(s): OPEN REDUCTION INTERNAL FIXATION (ORIF) RIGHT ANKLE FRACTURE on 09/08/2013   Consultants (if any): cardiology  Discharged Condition: Improved  Hospital Course: Lauren Durham is an 32 y.o. female who was admitted 09/08/2013 with a diagnosis of Ankle fracture, right and went to the operating room on 09/08/2013 and underwent the above named procedures.    She was given perioperative antibiotics:      Anti-infectives   Start     Dose/Rate Route Frequency Ordered Stop   09/08/13 1400  clindamycin (CLEOCIN) IVPB 600 mg     600 mg 100 mL/hr over 30 Minutes Intravenous Every 6 hours 09/08/13 1348 09/09/13 0127   09/08/13 0600  clindamycin (CLEOCIN) IVPB 900 mg     900 mg 100 mL/hr over 30 Minutes Intravenous On call to O.R. 09/07/13 1240 09/08/13 1013    .  She was given sequential compression devices, early ambulation, and aspirin for DVT prophylaxis.  She benefited maximally from the hospital stay and there were no complications.    Recent vital signs:  Filed Vitals:   09/09/13 1450  BP: 114/66  Pulse: 87  Temp: 98.2 F (36.8 C)  Resp: 18    Recent laboratory studies:  Lab  Results  Component Value Date   HGB 12.3 09/08/2013   Lab Results  Component Value Date   WBC 8.1 09/08/2013   PLT 147* 09/08/2013   No results found for this basename: INR   Lab Results  Component Value Date   NA 138 09/09/2013   K 4.2 09/09/2013   CL 105 09/09/2013   CO2 26 09/09/2013   BUN 11 09/09/2013   CREATININE 0.58 09/09/2013   GLUCOSE 124* 09/09/2013    Discharge Medications:     Medication List         ALEVE 220 MG tablet  Generic drug:  naproxen sodium  Take 220 mg by mouth every 4 (four) hours as needed.     aspirin EC 325 MG tablet  Take 1 tablet (325 mg total) by mouth 2 (two) times daily.     oxyCODONE 5 MG immediate release tablet  Commonly known as:  Oxy IR/ROXICODONE  Take 1-3 tablets (5-15 mg total) by mouth every 4 (four) hours as needed.     oxyCODONE-acetaminophen 5-325 MG per tablet  Commonly known as:  PERCOCET/ROXICET  Take 2 tablets by mouth every 4 (four) hours as needed for severe pain.        Diagnostic Studies: Dg Chest 2 View  09/08/2013   CLINICAL DATA:  32 year old female status post fall. Chest pain  and shortness of Breath. Initial encounter.  EXAM: CHEST  2 VIEW  COMPARISON:  05/27/2012.  FINDINGS: Lung volumes are stable and within normal limits. Normal cardiac size and mediastinal contours. Visualized tracheal air column is within normal limits. Incidental nipple shadow on the left. The lungs are clear. No pneumothorax or effusion. Mild scoliosis today probably is positional. No osseous abnormality identified.  IMPRESSION: No acute cardiopulmonary abnormality or acute traumatic injury identified.   Electronically Signed   By: Augusto Gamble M.D.   On: 09/08/2013 09:00   Dg Ankle Complete Right  09/08/2013   CLINICAL DATA:  Ankle fracture  EXAM: RIGHT ANKLE - COMPLETE 3+ VIEW  COMPARISON:  09/03/2013  FINDINGS: Multiple intraoperative spot images demonstrate Internal fixation of the distal fibular and tibial fractures. Anatomic  alignment. No hardware or bony complicating feature.  IMPRESSION: Internal fixation of the right ankle fractures.   Electronically Signed   By: Charlett Nose M.D.   On: 09/08/2013 11:56   Dg Ankle Complete Right  09/03/2013   CLINICAL DATA:  Fall with right ankle pain.  EXAM: RIGHT ANKLE - COMPLETE 3+ VIEW  COMPARISON:  None.  FINDINGS: Coronally oblique fracture through the distal fibula, at the level of the ankle joint. There is lateral displacement by 50%. There is a transverse fracture through the medial malleolus that is distracted. The distal tibial and fibular metastases have maintained alignment.  IMPRESSION: Displaced bimalleolar fractures.   Electronically Signed   By: Tiburcio Pea M.D.   On: 09/03/2013 01:44    Disposition: 01-Home or Self Care  Discharge Orders   Future Orders Complete By Expires   Call MD / Call 911  As directed    Comments:     If you experience chest pain or shortness of breath, CALL 911 and be transported to the hospital emergency room.  If you develope a fever above 101.5 F, pus (white drainage) or increased drainage or redness at the wound, or calf pain, call your surgeon's office.   Constipation Prevention  As directed    Comments:     Drink plenty of fluids.  Prune juice may be helpful.  You may use a stool softener, such as Colace (over the counter) 100 mg twice a day.  Use MiraLax (over the counter) for constipation as needed.   Diet - low sodium heart healthy  As directed    Driving restrictions  As directed    Comments:     No driving while taking narcotic pain meds.   Increase activity slowly as tolerated  As directed    Non weight bearing  As directed    Questions:     Laterality:     Extremity:     Non weight bearing  As directed    Questions:     Laterality:  right   Extremity:  Lower      Follow-up Information   Follow up with Cheral Almas, MD In 2 weeks.   Specialty:  Orthopedic Surgery   Contact information:   [address] [place] [address] [phone]        Signed: Cheral Almas 09/09/2013, 5:24 PM

## 2013-09-09 NOTE — Progress Notes (Signed)
Echo Lab  2D Echocardiogram completed.  Taiquan Campanaro L Sulo Janczak, RDCS 09/09/2013 9:51 AM

## 2013-09-11 NOTE — Care Management Note (Signed)
CARE MANAGEMENT NOTE 09/11/2013  Patient:  Lauren Durham,Lauren Durham   Account Number:  [id number]  Date Initiated:  09/11/2013  Documentation initiated by:  Vance Peper  Subjective/Objective Assessment:     Action/Plan:   No HH needs identified.   Anticipated DC Date:  09/09/2013   Anticipated DC Plan:  HOME/SELF CARE         Choice offered to / List presented to:             Status of service:  Completed, signed off Medicare Important Message given?   (If response is "NO", the following Medicare IM given date fields will be blank) Date Medicare IM given:   Date Additional Medicare IM given:    Discharge Disposition:  HOME/SELF CARE  Per UR Regulation:    If discussed at Long Length of Stay Meetings, dates discussed:    Comments:

## 2013-09-12 ENCOUNTER — Encounter (HOSPITAL_COMMUNITY): Payer: Self-pay | Admitting: Orthopaedic Surgery

## 2013-09-27 ENCOUNTER — Encounter (HOSPITAL_COMMUNITY): Payer: Self-pay | Admitting: Orthopaedic Surgery

## 2013-11-02 ENCOUNTER — Other Ambulatory Visit: Payer: Self-pay

## 2013-11-10 ENCOUNTER — Other Ambulatory Visit: Payer: Self-pay | Admitting: Obstetrics & Gynecology

## 2013-11-10 ENCOUNTER — Other Ambulatory Visit: Payer: Self-pay

## 2013-11-10 DIAGNOSIS — O3680X0 Pregnancy with inconclusive fetal viability, not applicable or unspecified: Secondary | ICD-10-CM

## 2013-11-13 ENCOUNTER — Encounter: Payer: Self-pay | Admitting: Women's Health

## 2013-11-17 ENCOUNTER — Other Ambulatory Visit: Payer: Self-pay

## 2013-11-27 ENCOUNTER — Other Ambulatory Visit: Payer: Self-pay | Admitting: Obstetrics & Gynecology

## 2013-11-27 ENCOUNTER — Ambulatory Visit (INDEPENDENT_AMBULATORY_CARE_PROVIDER_SITE_OTHER): Payer: Medicaid Other

## 2013-11-27 DIAGNOSIS — O26849 Uterine size-date discrepancy, unspecified trimester: Secondary | ICD-10-CM

## 2013-11-27 DIAGNOSIS — O99320 Drug use complicating pregnancy, unspecified trimester: Secondary | ICD-10-CM

## 2013-11-27 DIAGNOSIS — O3680X0 Pregnancy with inconclusive fetal viability, not applicable or unspecified: Secondary | ICD-10-CM

## 2013-11-27 DIAGNOSIS — F1920 Other psychoactive substance dependence, uncomplicated: Secondary | ICD-10-CM

## 2013-11-27 DIAGNOSIS — O09299 Supervision of pregnancy with other poor reproductive or obstetric history, unspecified trimester: Secondary | ICD-10-CM

## 2013-11-27 NOTE — Progress Notes (Signed)
U/S-single IUP with +FCA noted, Anterior Gr 0 placenta, cx appears closed (3.4cm), bilateral adnexa wnl, CRL c/w 13+2wks EDD 06/02/2014, FHR-156 bpm

## 2013-11-28 ENCOUNTER — Ambulatory Visit: Payer: Self-pay | Admitting: Adult Health

## 2013-11-28 ENCOUNTER — Telehealth: Payer: Self-pay | Admitting: Women's Health

## 2013-11-28 NOTE — Telephone Encounter (Signed)
Pt states "hurting in left side x 2 days ago, thinks she has a bladder infection." Appointment made for 2:45 pm with Cyril MourningJennifer Griffin, NP.

## 2013-12-05 ENCOUNTER — Encounter: Payer: Self-pay | Admitting: Women's Health

## 2013-12-05 ENCOUNTER — Ambulatory Visit (INDEPENDENT_AMBULATORY_CARE_PROVIDER_SITE_OTHER): Payer: Medicaid Other | Admitting: Women's Health

## 2013-12-05 ENCOUNTER — Other Ambulatory Visit (HOSPITAL_COMMUNITY)
Admission: RE | Admit: 2013-12-05 | Discharge: 2013-12-05 | Disposition: A | Discharge status: Home / Self Care | Payer: Medicaid Other | Source: Ambulatory Visit | Attending: Obstetrics & Gynecology | Admitting: Obstetrics & Gynecology

## 2013-12-05 ENCOUNTER — Encounter (INDEPENDENT_AMBULATORY_CARE_PROVIDER_SITE_OTHER): Payer: Self-pay

## 2013-12-05 VITALS — BP 110/64 | Ht 61.0 in | Wt 140.0 lb

## 2013-12-05 DIAGNOSIS — O99330 Smoking (tobacco) complicating pregnancy, unspecified trimester: Secondary | ICD-10-CM

## 2013-12-05 DIAGNOSIS — O99320 Drug use complicating pregnancy, unspecified trimester: Secondary | ICD-10-CM

## 2013-12-05 DIAGNOSIS — F17200 Nicotine dependence, unspecified, uncomplicated: Secondary | ICD-10-CM

## 2013-12-05 DIAGNOSIS — Z113 Encounter for screening for infections with a predominantly sexual mode of transmission: Secondary | ICD-10-CM | POA: Insufficient documentation

## 2013-12-05 DIAGNOSIS — O09899 Supervision of other high risk pregnancies, unspecified trimester: Secondary | ICD-10-CM | POA: Insufficient documentation

## 2013-12-05 DIAGNOSIS — Z01419 Encounter for gynecological examination (general) (routine) without abnormal findings: Secondary | ICD-10-CM

## 2013-12-05 DIAGNOSIS — Z1389 Encounter for screening for other disorder: Secondary | ICD-10-CM

## 2013-12-05 DIAGNOSIS — O09219 Supervision of pregnancy with history of pre-term labor, unspecified trimester: Secondary | ICD-10-CM

## 2013-12-05 DIAGNOSIS — Z124 Encounter for screening for malignant neoplasm of cervix: Secondary | ICD-10-CM | POA: Insufficient documentation

## 2013-12-05 DIAGNOSIS — Z331 Pregnant state, incidental: Secondary | ICD-10-CM

## 2013-12-05 DIAGNOSIS — Z1151 Encounter for screening for human papillomavirus (HPV): Secondary | ICD-10-CM | POA: Insufficient documentation

## 2013-12-05 DIAGNOSIS — Z23 Encounter for immunization: Secondary | ICD-10-CM

## 2013-12-05 DIAGNOSIS — Z3490 Encounter for supervision of normal pregnancy, unspecified, unspecified trimester: Secondary | ICD-10-CM | POA: Insufficient documentation

## 2013-12-05 DIAGNOSIS — F1920 Other psychoactive substance dependence, uncomplicated: Secondary | ICD-10-CM

## 2013-12-05 DIAGNOSIS — Z3480 Encounter for supervision of other normal pregnancy, unspecified trimester: Secondary | ICD-10-CM

## 2013-12-05 DIAGNOSIS — I341 Nonrheumatic mitral (valve) prolapse: Secondary | ICD-10-CM

## 2013-12-05 DIAGNOSIS — O0990 Supervision of high risk pregnancy, unspecified, unspecified trimester: Secondary | ICD-10-CM

## 2013-12-05 DIAGNOSIS — R87810 Cervical high risk human papillomavirus (HPV) DNA test positive: Secondary | ICD-10-CM | POA: Insufficient documentation

## 2013-12-05 DIAGNOSIS — O09299 Supervision of pregnancy with other poor reproductive or obstetric history, unspecified trimester: Secondary | ICD-10-CM

## 2013-12-05 NOTE — Progress Notes (Addendum)
Subjective:  Ruben GottronStacey Knutson is a 33 y.o. [id number] Caucasian female at 1944w3d by 13wk u/s, being seen today for her first obstetrical visit.  Her obstetrical history is significant for smoker, h/o spontaneous PTB @ 36wks x 2.  ORIF Rt ankle fx 09/08/14, just got boot off yesterday. Has been taking percocet only prn. [facility] and tx [facility].  H/O MVP, had 2D echo 09/09/14 after surgery: EF 60-65%, MVP stable. States she was told to begin asa 325mg  daily, but she did not. Pregnancy history fully reviewed.  Patient reports nausea, phenergan helps but makes her sleepy- requests another med . Denies vb, cramping, uti s/s, abnormal/malodorous vag d/c, or vulvovaginal itching/irritation.  BP 110/64  Ht 5\' 1"  (1.549 m)  Wt 140 lb (63.504 kg)  BMI 26.47 kg/m2  LMP 08/20/2013  HISTORY: OB History  Gravida Para Term Preterm AB SAB TAB Ectopic Multiple Living  5 2  2 2 2    2     # Outcome Date GA Lbr Len/2nd Weight Sex Delivery Anes PTL Lv  5 CUR           4 SAB 02/13/12          3 PRE 10/23/06 4165w0d  6 lb 9 oz (2.977 kg) M SVD   Y  2 SAB 01/31/06          1 PRE 10/29/99 10765w0d  6 lb 7 oz (2.92 kg) M SVD EPI  Y     Past Medical History  Diagnosis Date  . MVP (mitral valve prolapse)   . Seizures 05/2013    potassium was low, blood sugar was low  . Depression     not treated at present time  . Anxiety     not treated at present time  . Kidney infection     has had several  . Headache(784.0)     gets headache with menstrual period  . Cervical dysplasia   . Anemia     "low iron", anemic as a child  . Polycystic ovary syndrome    Past Surgical History  Procedure Laterality Date  . Diagnostic laparoscopy    . Orif ankle fracture Right 09/08/2013    Procedure: OPEN REDUCTION INTERNAL FIXATION (ORIF) RIGHT ANKLE FRACTURE;  Surgeon: Cheral AlmasNaiping Michael Xu, MD;  Location: [facility] OR;  Service: Orthopedics;  Laterality: Right;   Family History  Problem Relation Age of Onset  . Mitral  valve prolapse Mother   . Hypertension Mother   . COPD Father   . Diabetes type II Father   . Pancreatitis Father   . Heart disease Father   . ADD / ADHD Son   . Asthma Son   . Hypertension Maternal Grandmother   . Mitral valve prolapse Maternal Grandmother   . Cancer Paternal Grandmother   . ADD / ADHD Son   . Heart disease Son     Exam   System:     General: Well developed & nourished, no acute distress   Skin: Warm & dry, normal coloration and turgor, no rashes   Neurologic: Alert & oriented, normal mood   Cardiovascular: Regular rate & rhythm   Respiratory: Effort & rate normal, LCTAB, acyanotic   Abdomen: Soft, non tender   Extremities: normal strength, tone   Pelvic Exam:    Perineum: Normal perineum   Vulva: Normal, no lesions   Vagina:  Normal mucosa, normal discharge   Cervix: Normal, bulbous, appears closed   Uterus: Normal  size/shape/contour for GA   Thin prep pap smear obtained w/ high risk HPV cotesting FHR: 143 via doppler   Assessment:   Pregnancy: [id number] Patient Active Problem List   Diagnosis Date Noted  . Supervision of high-risk pregnancy 12/05/2013    Priority: High  . History of preterm delivery, currently pregnant 12/05/2013    Priority: High  . Mitral valve prolapse 09/08/2013    Priority: High  . Ankle fracture, right 09/08/2013  . Closed right ankle fracture 09/08/2013  . Syncope 09/08/2013    [redacted]w[redacted]d [id number] New OB visit Smoker Recent ORIF Rt ankle H/O spont PTB @ 36wks x 2 Stable MVP   Plan:  Initial labs drawn Continue prenatal vitamins Problem list reviewed and updated Reviewed n/v relief measures and warning s/s to report 2 Diclegis samples given Encouraged smoking cessation, QuitlineNC referral faxed Reviewed recommended weight gain based on pre-gravid BMI Encouraged well-balanced diet Genetic Screening discussed Quad Screen: requested Cystic fibrosis screening discussed requested Ultrasound discussed; fetal survey:  requested To call when receives [facility] ID so we can order Makena and rx Diclegis Follow up in 2 weeks  CCNC completed Discussed MVP w/ LHE, no change in management Flu shot today  Marge Duncans CNM, [facility] 12/05/2013 3:18 PM

## 2013-12-05 NOTE — Patient Instructions (Addendum)
Nausea & Vomiting  Have saltine crackers or pretzels by your bed and eat a few bites before you raise your head out of bed in the morning  Eat small frequent meals throughout the day instead of large meals  Drink plenty of fluids throughout the day to stay hydrated, just don't drink a lot of fluids with your meals.  This can make your stomach fill up faster making you feel sick  Do not brush your teeth right after you eat  Products with real ginger are good for nausea, like ginger ale and ginger hard candy Make sure it says made with real ginger!  Sucking on sour candy like lemon heads is also good for nausea  If your prenatal vitamins make you nauseated, take them at night so you will sleep through the nausea  If you feel like you need medicine for the nausea & vomiting please let us know  If you are unable to keep any fluids or food down please let us know    Second Trimester of Pregnancy The second trimester is from week 13 through week 28, months 4 through 6. The second trimester is often a time when you feel your best. Your body has also adjusted to being pregnant, and you begin to feel better physically. Usually, morning sickness has lessened or quit completely, you may have more energy, and you may have an increase in appetite. The second trimester is also a time when the fetus is growing rapidly. At the end of the sixth month, the fetus is about 9 inches long and weighs about 1 pounds. You will likely begin to feel the baby move (quickening) between 18 and 20 weeks of the pregnancy. BODY CHANGES Your body goes through many changes during pregnancy. The changes vary from woman to woman.   Your weight will continue to increase. You will notice your lower abdomen bulging out.  You may begin to get stretch marks on your hips, abdomen, and breasts.  You may develop headaches that can be relieved by medicines approved by your caregiver.  You may urinate more often because the fetus  is pressing on your bladder.  You may develop or continue to have heartburn as a result of your pregnancy.  You may develop constipation because certain hormones are causing the muscles that push waste through your intestines to slow down.  You may develop hemorrhoids or swollen, bulging veins (varicose veins).  You may have back pain because of the weight gain and pregnancy hormones relaxing your joints between the bones in your pelvis and as a result of a shift in weight and the muscles that support your balance.  Your breasts will continue to grow and be tender.  Your gums may bleed and may be sensitive to brushing and flossing.  Dark spots or blotches (chloasma, mask of pregnancy) may develop on your face. This will likely fade after the baby is born.  A dark line from your belly button to the pubic area (linea nigra) may appear. This will likely fade after the baby is born. WHAT TO EXPECT AT YOUR PRENATAL VISITS During a routine prenatal visit:  You will be weighed to make sure you and the fetus are growing normally.  Your blood pressure will be taken.  Your abdomen will be measured to track your baby's growth.  The fetal heartbeat will be listened to.  Any test results from the previous visit will be discussed. Your caregiver may ask you:  How you are feeling.    If you are feeling the baby move.  If you have had any abnormal symptoms, such as leaking fluid, bleeding, severe headaches, or abdominal cramping.  If you have any questions. Other tests that may be performed during your second trimester include:  Blood tests that check for:  Low iron levels (anemia).  Gestational diabetes (between 24 and 28 weeks).  Rh antibodies.  Urine tests to check for infections, diabetes, or protein in the urine.  An ultrasound to confirm the proper growth and development of the baby.  An amniocentesis to check for possible genetic problems.  Fetal screens for spina bifida  and Down syndrome. HOME CARE INSTRUCTIONS   Avoid all smoking, herbs, alcohol, and unprescribed drugs. These chemicals affect the formation and growth of the baby.  Follow your caregiver's instructions regarding medicine use. There are medicines that are either safe or unsafe to take during pregnancy.  Exercise only as directed by your caregiver. Experiencing uterine cramps is a good sign to stop exercising.  Continue to eat regular, healthy meals.  Wear a good support bra for breast tenderness.  Do not use hot tubs, steam rooms, or saunas.  Wear your seat belt at all times when driving.  Avoid raw meat, uncooked cheese, cat litter boxes, and soil used by cats. These carry germs that can cause birth defects in the baby.  Take your prenatal vitamins.  Try taking a stool softener (if your caregiver approves) if you develop constipation. Eat more high-fiber foods, such as fresh vegetables or fruit and whole grains. Drink plenty of fluids to keep your urine clear or pale yellow.  Take warm sitz baths to soothe any pain or discomfort caused by hemorrhoids. Use hemorrhoid cream if your caregiver approves.  If you develop varicose veins, wear support hose. Elevate your feet for 15 minutes, 3 4 times a day. Limit salt in your diet.  Avoid heavy lifting, wear low heel shoes, and practice good posture.  Rest with your legs elevated if you have leg cramps or low back pain.  Visit your dentist if you have not gone yet during your pregnancy. Use a soft toothbrush to brush your teeth and be gentle when you floss.  A sexual relationship may be continued unless your caregiver directs you otherwise.  Continue to go to all your prenatal visits as directed by your caregiver. SEEK MEDICAL CARE IF:   You have dizziness.  You have mild pelvic cramps, pelvic pressure, or nagging pain in the abdominal area.  You have persistent nausea, vomiting, or diarrhea.  You have a bad smelling vaginal  discharge.  You have pain with urination. SEEK IMMEDIATE MEDICAL CARE IF:   You have a fever.  You are leaking fluid from your vagina.  You have spotting or bleeding from your vagina.  You have severe abdominal cramping or pain.  You have rapid weight gain or loss.  You have shortness of breath with chest pain.  You notice sudden or extreme swelling of your face, hands, ankles, feet, or legs.  You have not felt your baby move in over an hour.  You have severe headaches that do not go away with medicine.  You have vision changes. Document Released: 08/25/2001 Document Revised: 05/03/2013 Document Reviewed: 11/01/2012 [employer] Patient Information 2014 [employer].  

## 2013-12-06 ENCOUNTER — Encounter: Payer: Self-pay | Admitting: Women's Health

## 2013-12-06 DIAGNOSIS — F1290 Cannabis use, unspecified, uncomplicated: Secondary | ICD-10-CM | POA: Insufficient documentation

## 2013-12-06 LAB — VARICELLA ZOSTER ANTIBODY, IGG: VARICELLA IGG: 2548 {index} — AB (ref <135.00)

## 2013-12-06 LAB — AFP, QUAD SCREEN
AFP: 18.3 IU/mL
CURR GEST AGE: 14.3 wks.days
Down Syndrome Scr Risk Est: 1:3890 {titer}
HCG, Total: 15260 m[IU]/mL
INH: 241.2 pg/mL
Interpretation-AFP: NEGATIVE
MOM FOR AFP: 0.72
MoM for INH: 1.14
MoM for hCG: 0.42
Open Spina bifida: NEGATIVE
Osb Risk: <1:27300 {titer}
Tri 18 Scr Risk Est: NEGATIVE
Trisomy 18 (Edward) Syndrome Interp.: 1:1360 {titer}
UE3 VALUE: 0.2 ng/mL
uE3 Mom: 0.76

## 2013-12-06 LAB — DRUG SCREEN, URINE, NO CONFIRMATION
AMPHETAMINE SCRN UR: NEGATIVE
BENZODIAZEPINES.: POSITIVE — AB
Barbiturate Quant, Ur: NEGATIVE
COCAINE METABOLITES: NEGATIVE
Creatinine,U: 132.2 mg/dL
Marijuana Metabolite: POSITIVE — AB
Methadone: NEGATIVE
Opiate Screen, Urine: NEGATIVE
PHENCYCLIDINE (PCP): NEGATIVE
Propoxyphene: NEGATIVE

## 2013-12-06 LAB — URINALYSIS, ROUTINE W REFLEX MICROSCOPIC
Bilirubin Urine: NEGATIVE
Glucose, UA: NEGATIVE mg/dL
Hgb urine dipstick: NEGATIVE
Ketones, ur: NEGATIVE mg/dL
Leukocytes, UA: NEGATIVE
Nitrite: NEGATIVE
Protein, ur: NEGATIVE mg/dL
SPECIFIC GRAVITY, URINE: 1.016 (ref 1.005–1.030)
UROBILINOGEN UA: 1 mg/dL (ref 0.0–1.0)
pH: 7 (ref 5.0–8.0)

## 2013-12-06 LAB — OXYCODONE SCREEN, UA, RFLX CONFIRM

## 2013-12-06 LAB — ABO AND RH: Rh Type: POSITIVE

## 2013-12-06 LAB — CBC
HEMATOCRIT: 38.9 % (ref 36.0–46.0)
Hemoglobin: 13.4 g/dL (ref 12.0–15.0)
MCH: 31.4 pg (ref 26.0–34.0)
MCHC: 34.4 g/dL (ref 30.0–36.0)
MCV: 91.1 fL (ref 78.0–100.0)
Platelets: 217 10*3/uL (ref 150–400)
RBC: 4.27 MIL/uL (ref 3.87–5.11)
RDW: 13.2 % (ref 11.5–15.5)
WBC: 7.8 10*3/uL (ref 4.0–10.5)

## 2013-12-06 LAB — RUBELLA SCREEN: Rubella: 2.12 Index — ABNORMAL HIGH (ref <0.90)

## 2013-12-06 LAB — RPR

## 2013-12-06 LAB — ANTIBODY SCREEN: Antibody Screen: NEGATIVE

## 2013-12-06 LAB — TSH: TSH: 1.812 u[IU]/mL (ref 0.350–4.500)

## 2013-12-06 LAB — HEPATITIS B SURFACE ANTIGEN: HEP B S AG: NEGATIVE

## 2013-12-06 LAB — HIV ANTIBODY (ROUTINE TESTING W REFLEX): HIV: NONREACTIVE

## 2013-12-07 LAB — OPIATES/OPIOIDS (LC/MS-MS)
Codeine Urine: NEGATIVE ng/mL
Heroin (6-AM), UR: NEGATIVE ng/mL
Hydrocodone: NEGATIVE ng/mL
Hydromorphone: NEGATIVE ng/mL
MORPHINE: NEGATIVE ng/mL
NORHYDROCODONE, UR: NEGATIVE ng/mL
Noroxycodone, Ur: 6798 ng/mL
OXYMORPHONE, URINE: 5219 ng/mL
Oxycodone, ur: 8036 ng/mL

## 2013-12-07 LAB — CYSTIC FIBROSIS DIAGNOSTIC STUDY

## 2013-12-07 LAB — URINE CULTURE
COLONY COUNT: NO GROWTH
Organism ID, Bacteria: NO GROWTH

## 2013-12-08 ENCOUNTER — Telehealth: Payer: Self-pay | Admitting: Women's Health

## 2013-12-08 ENCOUNTER — Encounter: Payer: Self-pay | Admitting: Women's Health

## 2013-12-08 LAB — POCT URINALYSIS DIPSTICK
Blood, UA: NEGATIVE
Glucose, UA: NEGATIVE
Ketones, UA: NEGATIVE
LEUKOCYTES UA: NEGATIVE
NITRITE UA: NEGATIVE
Protein, UA: NEGATIVE

## 2013-12-08 NOTE — Addendum Note (Signed)
Addended by: Richardson ChiquitoRAVIS, Emonte Dieujuste M on: 12/08/2013 11:15 AM   Modules accepted: Orders

## 2013-12-08 NOTE — Addendum Note (Signed)
Addended by: Richardson ChiquitoRAVIS, Verlon Carcione M on: 12/08/2013 11:13 AM   Modules accepted: Orders

## 2013-12-11 ENCOUNTER — Encounter: Payer: Self-pay | Admitting: Women's Health

## 2013-12-11 DIAGNOSIS — R87810 Cervical high risk human papillomavirus (HPV) DNA test positive: Secondary | ICD-10-CM | POA: Insufficient documentation

## 2013-12-12 NOTE — Telephone Encounter (Signed)
Pt states diclegis is "giving her really bad stomach cramps." Pt states read on-line were diclegis caused gastro cramping. Please advise.

## 2013-12-13 NOTE — Telephone Encounter (Signed)
Pt states has a RX for Zofran, will try for the nausea if no improvement will call our office back.

## 2013-12-19 ENCOUNTER — Encounter: Payer: Self-pay | Admitting: Obstetrics & Gynecology

## 2013-12-22 ENCOUNTER — Encounter: Payer: Self-pay | Admitting: Obstetrics & Gynecology

## 2014-03-08 LAB — OB RESULTS CONSOLE HGB/HCT, BLOOD
HEMATOCRIT: 35 %
HEMOGLOBIN: 12 g/dL

## 2014-03-08 LAB — OB RESULTS CONSOLE RPR: RPR: NONREACTIVE

## 2014-03-08 LAB — OB RESULTS CONSOLE HIV ANTIBODY (ROUTINE TESTING): HIV: NONREACTIVE

## 2014-03-08 LAB — OB RESULTS CONSOLE PLATELET COUNT: Platelets: 144 10*3/uL

## 2014-07-16 ENCOUNTER — Encounter: Payer: Self-pay | Admitting: Women's Health

## 2014-10-02 ENCOUNTER — Encounter (HOSPITAL_COMMUNITY): Payer: Self-pay | Admitting: *Deleted

## 2014-12-14 ENCOUNTER — Emergency Department (HOSPITAL_COMMUNITY)
Admission: EM | Admit: 2014-12-14 | Discharge: 2014-12-14 | Disposition: A | Discharge status: Home / Self Care | Payer: Medicaid Other | Attending: Emergency Medicine | Admitting: Emergency Medicine

## 2014-12-14 ENCOUNTER — Encounter (HOSPITAL_COMMUNITY): Payer: Self-pay

## 2014-12-14 DIAGNOSIS — Z79899 Other long term (current) drug therapy: Secondary | ICD-10-CM | POA: Insufficient documentation

## 2014-12-14 DIAGNOSIS — Z8639 Personal history of other endocrine, nutritional and metabolic disease: Secondary | ICD-10-CM | POA: Insufficient documentation

## 2014-12-14 DIAGNOSIS — Z7982 Long term (current) use of aspirin: Secondary | ICD-10-CM | POA: Insufficient documentation

## 2014-12-14 DIAGNOSIS — Z8659 Personal history of other mental and behavioral disorders: Secondary | ICD-10-CM | POA: Insufficient documentation

## 2014-12-14 DIAGNOSIS — Z862 Personal history of diseases of the blood and blood-forming organs and certain disorders involving the immune mechanism: Secondary | ICD-10-CM | POA: Insufficient documentation

## 2014-12-14 DIAGNOSIS — Z720 Tobacco use: Secondary | ICD-10-CM | POA: Insufficient documentation

## 2014-12-14 DIAGNOSIS — M545 Low back pain, unspecified: Secondary | ICD-10-CM

## 2014-12-14 DIAGNOSIS — R600 Localized edema: Secondary | ICD-10-CM | POA: Insufficient documentation

## 2014-12-14 DIAGNOSIS — I10 Essential (primary) hypertension: Secondary | ICD-10-CM | POA: Insufficient documentation

## 2014-12-14 DIAGNOSIS — Z87448 Personal history of other diseases of urinary system: Secondary | ICD-10-CM | POA: Insufficient documentation

## 2014-12-14 DIAGNOSIS — Z8742 Personal history of other diseases of the female genital tract: Secondary | ICD-10-CM | POA: Insufficient documentation

## 2014-12-14 HISTORY — DX: Essential (primary) hypertension: I10

## 2014-12-14 LAB — URINALYSIS, ROUTINE W REFLEX MICROSCOPIC
Bilirubin Urine: NEGATIVE
Glucose, UA: NEGATIVE mg/dL
Hgb urine dipstick: NEGATIVE
Ketones, ur: NEGATIVE mg/dL
Leukocytes, UA: NEGATIVE
NITRITE: NEGATIVE
Protein, ur: NEGATIVE mg/dL
Specific Gravity, Urine: >1.03 — ABNORMAL HIGH (ref 1.005–1.030)
UROBILINOGEN UA: 0.2 mg/dL (ref 0.0–1.0)
pH: 6 (ref 5.0–8.0)

## 2014-12-14 MED ORDER — FUROSEMIDE 20 MG PO TABS: 20.0000 mg | ORAL_TABLET | Freq: Every day | ORAL | Status: DC

## 2014-12-14 MED ORDER — HYDROCODONE-ACETAMINOPHEN 5-325 MG PO TABS: 1.0000 | ORAL_TABLET | Freq: Four times a day (QID) | ORAL | Status: DC | PRN

## 2014-12-14 NOTE — ED Notes (Signed)
Flank pain with little urination since yesterday. Hx of pyelonephritis, feels the same. Also c/o swollen legs

## 2014-12-14 NOTE — ED Provider Notes (Signed)
CSN: [id number]     Arrival date & time 12/14/14  2125 History  This chart was scribed for Bethann BerkshireJoseph Kimble Delaurentis, MD by Tanda RockersMargaux Venter, ED Scribe. This patient was seen in room APA11/APA11 and the patient's care was started at 10:38 PM.    Chief Complaint  Patient presents with  . Flank Pain   Patient is a 34 y.o. female presenting with flank pain. The history is provided by the patient. No language interpreter was used.  Flank Pain This is a new problem. The current episode started 12 to 24 hours ago. The problem has been gradually worsening. Pertinent negatives include no chest pain, no abdominal pain and no headaches.     HPI Comments: Lauren GottronStacey Durham is a 34 y.o. female with PMHx pyelonephritis who presents to the Emergency Department complaining of lower back painthat began 1 day ago, worsened today. Pt also complains of nausea and vomiting. She states that she vomited twice today. She mentions that these symptoms feel similar to previous pyelo. Pt also complains of bilateral lower extremity swelling and pain. She denies fever, chills, or any other symptoms.   Past Medical History  Diagnosis Date  . MVP (mitral valve prolapse)   . Seizures 05/2013    potassium was low, blood sugar was low  . Depression     not treated at present time  . Anxiety     not treated at present time  . Kidney infection     has had several  . Headache(784.0)     gets headache with menstrual period  . Cervical dysplasia   . Anemia     "low iron", anemic as a child  . Polycystic ovary syndrome   . Hypertension    Past Surgical History  Procedure Laterality Date  . Diagnostic laparoscopy    . Orif ankle fracture Right 09/08/2013    Procedure: OPEN REDUCTION INTERNAL FIXATION (ORIF) RIGHT ANKLE FRACTURE;  Surgeon: Cheral AlmasNaiping Michael Xu, MD;  Location: [facility] OR;  Service: Orthopedics;  Laterality: Right;   Family History  Problem Relation Age of Onset  . Mitral valve prolapse Mother   . Hypertension Mother   . COPD  Father   . Diabetes type II Father   . Pancreatitis Father   . Heart disease Father   . ADD / ADHD Son   . Asthma Son   . Hypertension Maternal Grandmother   . Mitral valve prolapse Maternal Grandmother   . Cancer Paternal Grandmother   . ADD / ADHD Son   . Heart disease Son    History  Substance Use Topics  . Smoking status: Current Some Day Smoker -- 0.50 packs/day    Types: Cigarettes  . Smokeless tobacco: Never Used  . Alcohol Use: No   OB History    Gravida Para Term Preterm AB TAB SAB Ectopic Multiple Living   5 2  2 2  2   2      Review of Systems  Constitutional: Negative for fever, chills, appetite change and fatigue.  HENT: Negative for congestion, ear discharge and sinus pressure.   Eyes: Negative for discharge.  Respiratory: Negative for cough.   Cardiovascular: Positive for leg swelling (Bilateral. ). Negative for chest pain.  Gastrointestinal: Negative for abdominal pain and diarrhea.  Genitourinary: Positive for flank pain. Negative for frequency and hematuria.  Musculoskeletal: Positive for back pain (Lower back pain. ).  Skin: Negative for rash.  Neurological: Negative for seizures and headaches.  Psychiatric/Behavioral: Negative for hallucinations.  Allergies  Tape; Keflex; Penicillins; and Sulfa antibiotics  Home Medications   Prior to Admission medications   Medication Sig Start Date End Date Taking? Authorizing Provider  lisinopril (PRINIVIL,ZESTRIL) 10 MG tablet Take 10 mg by mouth daily.   Yes Historical Provider, MD  aspirin EC 325 MG tablet Take 1 tablet (325 mg total) by mouth 2 (two) times daily. Patient not taking: Reported on 12/14/2014 09/08/13   Tarry Kos, MD  oxyCODONE (OXY IR/ROXICODONE) 5 MG immediate release tablet Take 1-3 tablets (5-15 mg total) by mouth every 4 (four) hours as needed. Patient not taking: Reported on 12/14/2014 09/08/13   Tarry Kos, MD   Triage Vitals: BP 135/91 mmHg  Pulse 109  Temp(Src) 98.5 F (36.9  C) (Oral)  Resp 20  Ht  (1.575 m)  Wt 193 lb (87.544 kg)  BMI 35.29 kg/m2  SpO2 100%  LMP 11/10/2014  Breastfeeding? No   Physical Exam  Constitutional: She is oriented to person, place, and time. She appears well-developed.  HENT:  Head: Normocephalic.  Eyes: Conjunctivae and EOM are normal. No scleral icterus.  Neck: Neck supple. No thyromegaly present.  Cardiovascular: Normal rate, regular rhythm and normal heart sounds.  Exam reveals no gallop and no friction rub.   No murmur heard. Pulmonary/Chest: Effort normal and breath sounds normal. No stridor. She has no wheezes. She has no rales. She exhibits no tenderness.  Abdominal: She exhibits no distension. There is no rebound.  Moderate tenderness to right and left flank.  Musculoskeletal: Normal range of motion. She exhibits edema (1+ edema bilaterally to lower extremities. ).  Lymphadenopathy:    She has no cervical adenopathy.  Neurological: She is oriented to person, place, and time. She exhibits normal muscle tone. Coordination normal.  Skin: No rash noted. No erythema.  Psychiatric: She has a normal mood and affect. Her behavior is normal.    ED Course  Procedures (including critical care time)  DIAGNOSTIC STUDIES: Oxygen Saturation is 100% on RA, normal by my interpretation.    COORDINATION OF CARE: 10:40 PM-Discussed treatment plan which includes UA with pt at bedside and pt agreed to plan.   Labs Review Labs Reviewed  URINALYSIS, ROUTINE W REFLEX MICROSCOPIC    Imaging Review No results found.   EKG Interpretation None      MDM   Final diagnoses:  None   Back pain,   And edema,  tx with lasix and pain meds with follow up  The chart was scribed for me under my direct supervision.  I personally performed the history, physical, and medical decision making and all procedures in the evaluation of this patient.Bethann Berkshire, MD 12/14/14 [phone]

## 2014-12-14 NOTE — Discharge Instructions (Signed)
Follow up in 1-2 weeks for recheck °

## 2015-01-04 ENCOUNTER — Emergency Department (HOSPITAL_COMMUNITY)
Admission: EM | Admit: 2015-01-04 | Discharge: 2015-01-05 | Disposition: A | Discharge status: Home / Self Care | Payer: Medicaid Other | Attending: Emergency Medicine | Admitting: Emergency Medicine

## 2015-01-04 ENCOUNTER — Encounter (HOSPITAL_COMMUNITY): Payer: Self-pay | Admitting: Emergency Medicine

## 2015-01-04 DIAGNOSIS — M545 Low back pain, unspecified: Secondary | ICD-10-CM

## 2015-01-04 DIAGNOSIS — R112 Nausea with vomiting, unspecified: Secondary | ICD-10-CM | POA: Insufficient documentation

## 2015-01-04 DIAGNOSIS — Z8669 Personal history of other diseases of the nervous system and sense organs: Secondary | ICD-10-CM | POA: Insufficient documentation

## 2015-01-04 DIAGNOSIS — Z880 Allergy status to penicillin: Secondary | ICD-10-CM | POA: Insufficient documentation

## 2015-01-04 DIAGNOSIS — K5900 Constipation, unspecified: Secondary | ICD-10-CM | POA: Insufficient documentation

## 2015-01-04 DIAGNOSIS — R509 Fever, unspecified: Secondary | ICD-10-CM | POA: Insufficient documentation

## 2015-01-04 DIAGNOSIS — G8929 Other chronic pain: Secondary | ICD-10-CM | POA: Insufficient documentation

## 2015-01-04 DIAGNOSIS — Z862 Personal history of diseases of the blood and blood-forming organs and certain disorders involving the immune mechanism: Secondary | ICD-10-CM | POA: Insufficient documentation

## 2015-01-04 DIAGNOSIS — Z8742 Personal history of other diseases of the female genital tract: Secondary | ICD-10-CM | POA: Insufficient documentation

## 2015-01-04 DIAGNOSIS — Z3202 Encounter for pregnancy test, result negative: Secondary | ICD-10-CM | POA: Insufficient documentation

## 2015-01-04 DIAGNOSIS — Z7982 Long term (current) use of aspirin: Secondary | ICD-10-CM | POA: Insufficient documentation

## 2015-01-04 DIAGNOSIS — Z8659 Personal history of other mental and behavioral disorders: Secondary | ICD-10-CM | POA: Insufficient documentation

## 2015-01-04 DIAGNOSIS — Z79899 Other long term (current) drug therapy: Secondary | ICD-10-CM | POA: Insufficient documentation

## 2015-01-04 DIAGNOSIS — I10 Essential (primary) hypertension: Secondary | ICD-10-CM | POA: Insufficient documentation

## 2015-01-04 DIAGNOSIS — Z87410 Personal history of cervical dysplasia: Secondary | ICD-10-CM | POA: Insufficient documentation

## 2015-01-04 DIAGNOSIS — Z720 Tobacco use: Secondary | ICD-10-CM | POA: Insufficient documentation

## 2015-01-04 DIAGNOSIS — Z8639 Personal history of other endocrine, nutritional and metabolic disease: Secondary | ICD-10-CM | POA: Insufficient documentation

## 2015-01-04 LAB — URINALYSIS, ROUTINE W REFLEX MICROSCOPIC
Bilirubin Urine: NEGATIVE
Glucose, UA: NEGATIVE mg/dL
Hgb urine dipstick: NEGATIVE
Ketones, ur: NEGATIVE mg/dL
Leukocytes, UA: NEGATIVE
NITRITE: NEGATIVE
PH: 7 (ref 5.0–8.0)
Protein, ur: NEGATIVE mg/dL
SPECIFIC GRAVITY, URINE: 1.01 (ref 1.005–1.030)
UROBILINOGEN UA: 1 mg/dL (ref 0.0–1.0)

## 2015-01-04 LAB — PREGNANCY, URINE: Preg Test, Ur: NEGATIVE

## 2015-01-04 MED ORDER — ONDANSETRON HCL 4 MG/2ML IJ SOLN
4.0000 mg | Freq: Once | INTRAMUSCULAR | Status: AC
  Administered 2015-01-05: 4 mg via INTRAVENOUS
  Filled 2015-01-04: qty 2

## 2015-01-04 MED ORDER — SODIUM CHLORIDE 0.9 % IV BOLUS (SEPSIS)
1000.0000 mL | Freq: Once | INTRAVENOUS | Status: AC
  Administered 2015-01-05: 1000 mL via INTRAVENOUS

## 2015-01-04 MED ORDER — KETOROLAC TROMETHAMINE 30 MG/ML IJ SOLN
30.0000 mg | Freq: Once | INTRAMUSCULAR | Status: AC
  Administered 2015-01-05: 30 mg via INTRAVENOUS
  Filled 2015-01-04: qty 1

## 2015-01-04 NOTE — ED Provider Notes (Signed)
CSN: [id number]     Arrival date & time 01/04/15  2102 History  This chart was scribed for Lauren Raceravid Nykira Reddix, MD by Phillis HaggisGabriella Gaje, ED Scribe. This patient was seen in room APA04/APA04 and patient care was started at 11:27 PM.     Chief Complaint  Patient presents with  . Flank Pain  . Emesis   Patient is a 34 y.o. female presenting with flank pain and vomiting. The history is provided by the patient. No language interpreter was used.  Flank Pain Associated symptoms include abdominal pain. Pertinent negatives include no chest pain and no shortness of breath.  Emesis Associated symptoms: abdominal pain and myalgias   Associated symptoms: no diarrhea     HPI Comments: Lauren Durham is a 34 y.o. female who presents to the Emergency Department complaining of chronic bilateral flank pain for the past few weeks and emesis onset earlier today. She reports that her pain radiates down to her lower abdomen. She states that the pain worsens with movement. She states that she has had emesis about 5 times today, heartburn, and a fever. She reports taking 6 ibuprofen for the fever to some relief. She denies dysuria, hematuria or frequency. She states that she has been constipated for the past 3 weeks, having only had 3 bowel movements in that time. She states that she has had emesis after every attempt to eat. Denies vaginal discharge or bleeding.  Past Medical History  Diagnosis Date  . MVP (mitral valve prolapse)   . Seizures 05/2013    potassium was low, blood sugar was low  . Depression     not treated at present time  . Anxiety     not treated at present time  . Kidney infection     has had several  . Headache(784.0)     gets headache with menstrual period  . Cervical dysplasia   . Anemia     "low iron", anemic as a child  . Polycystic ovary syndrome   . Hypertension    Past Surgical History  Procedure Laterality Date  . Diagnostic laparoscopy    . Orif ankle fracture Right 09/08/2013     Procedure: OPEN REDUCTION INTERNAL FIXATION (ORIF) RIGHT ANKLE FRACTURE;  Surgeon: Cheral AlmasNaiping Michael Xu, MD;  Location: [facility] OR;  Service: Orthopedics;  Laterality: Right;   Family History  Problem Relation Age of Onset  . Mitral valve prolapse Mother   . Hypertension Mother   . COPD Father   . Diabetes type II Father   . Pancreatitis Father   . Heart disease Father   . ADD / ADHD Son   . Asthma Son   . Hypertension Maternal Grandmother   . Mitral valve prolapse Maternal Grandmother   . Cancer Paternal Grandmother   . ADD / ADHD Son   . Heart disease Son    History  Substance Use Topics  . Smoking status: Current Some Day Smoker -- 0.50 packs/day    Types: Cigarettes  . Smokeless tobacco: Never Used  . Alcohol Use: No   OB History    Gravida Para Term Preterm AB TAB SAB Ectopic Multiple Living   5 2  2 2  2   2      Review of Systems  Constitutional: Positive for fever and fatigue.  HENT: Negative for congestion.   Respiratory: Negative for cough and shortness of breath.   Cardiovascular: Negative for chest pain.  Gastrointestinal: Positive for nausea, vomiting, abdominal pain and constipation. Negative for diarrhea and  blood in stool.  Genitourinary: Positive for flank pain. Negative for dysuria, frequency, hematuria, vaginal bleeding, vaginal discharge and difficulty urinating.  Musculoskeletal: Positive for myalgias and back pain. Negative for neck pain and neck stiffness.  Skin: Negative for rash and wound.  Neurological: Negative for dizziness, weakness, light-headedness and numbness.  All other systems reviewed and are negative.  Allergies  Tape; Keflex; Penicillins; and Sulfa antibiotics  Home Medications   Prior to Admission medications   Medication Sig Start Date End Date Taking? Authorizing Provider  aspirin EC 325 MG tablet Take 1 tablet (325 mg total) by mouth 2 (two) times daily. Patient not taking: Reported on 12/14/2014 09/08/13   Tarry Kos, MD   famotidine (PEPCID) 20 MG tablet Take 1 tablet (20 mg total) by mouth 2 (two) times daily. 01/05/15   Lauren Racer, MD  furosemide (LASIX) 20 MG tablet Take 1 tablet (20 mg total) by mouth daily. 12/14/14   Bethann Berkshire, MD  HYDROcodone-acetaminophen (NORCO/VICODIN) 5-325 MG per tablet Take 1 tablet by mouth every 6 (six) hours as needed. 12/14/14   Bethann Berkshire, MD  lisinopril (PRINIVIL,ZESTRIL) 10 MG tablet Take 10 mg by mouth daily.    Historical Provider, MD  methocarbamol (ROBAXIN) 500 MG tablet Take 2 tablets (1,000 mg total) by mouth every 8 (eight) hours as needed for muscle spasms. 01/05/15   Lauren Racer, MD  ondansetron (ZOFRAN ODT) 4 MG disintegrating tablet  ODT q4 hours prn nausea/vomit 01/05/15   Lauren Racer, MD  oxyCODONE (OXY IR/ROXICODONE) 5 MG immediate release tablet Take 1-3 tablets (5-15 mg total) by mouth every 4 (four) hours as needed. Patient not taking: Reported on 12/14/2014 09/08/13   Tarry Kos, MD   BP 112/79 mmHg  Pulse 97  Temp(Src) 99.9 F (37.7 C) (Oral)  Resp 20  Ht  (1.549 m)  Wt 198 lb (89.812 kg)  BMI 37.43 kg/m2  SpO2 99%  LMP 11/10/2014 Physical Exam  Constitutional: She is oriented to person, place, and time. She appears well-developed and well-nourished. No distress.  HENT:  Head: Normocephalic and atraumatic.  Mouth/Throat: Oropharynx is clear and moist. No oropharyngeal exudate.  Eyes: EOM are normal. Pupils are equal, round, and reactive to light.  Neck: Normal range of motion. Neck supple.  No meningismus  Cardiovascular: Normal rate and regular rhythm.   Pulmonary/Chest: Effort normal and breath sounds normal. No respiratory distress. She has no wheezes. She has no rales. She exhibits no tenderness.  Abdominal: Soft. Bowel sounds are normal. She exhibits no distension and no mass. There is no tenderness. There is no rebound and no guarding.  Musculoskeletal: Normal range of motion. She exhibits tenderness (patient with  bilateral paraspinal lumbar tenderness to palpation. No midline tenderness. No definite CVA tenderness bilaterally.). She exhibits no edema.  Neurological: She is alert and oriented to person, place, and time.  5/5 motor in all extremities. Sensation is fully intact. No saddle anesthesia.  Skin: Skin is warm and dry. No rash noted. No erythema.  Psychiatric: She has a normal mood and affect. Her behavior is normal.  Nursing note and vitals reviewed.   ED Course  Procedures (including critical care time) DIAGNOSTIC STUDIES: Oxygen Saturation is 99% on room air, normal by my interpretation.    COORDINATION OF CARE: 11:33 PM-Discussed treatment plan which includes pain medication and labs with pt at bedside and pt agreed to plan.   Labs Review Labs Reviewed  COMPREHENSIVE METABOLIC PANEL - Abnormal; Notable for the following:  Potassium 3.4 (*)    Glucose, Bld 126 (*)    Calcium 8.0 (*)    Albumin 3.4 (*)    All other components within normal limits  URINALYSIS, ROUTINE W REFLEX MICROSCOPIC  PREGNANCY, URINE  CBC WITH DIFFERENTIAL/PLATELET  LIPASE, BLOOD   Imaging Review No results found.   EKG Interpretation None      MDM   Final diagnoses:  Bilateral low back pain without sciatica  Non-intractable vomiting with nausea, vomiting of unspecified type    I personally performed the services described in this documentation, which was scribed in my presence. The recorded information has been reviewed and is accurate.   Patient no further vomiting in the emergency department. Feels better after IV fluids. Tolerating PO trial.   Back pain is reproduced with palpation of the paraspinal lumbar muscles. I have low suspicion for epidural abscess or cauda equina. She has a normal neurologic exam. Patient does have 1 day of vomiting. This may be either viral or related to her increased NSAID consumption. Start on Pepcid and advised to decrease NSAID use. She's been given return  precautions and is voiced understanding.  Lauren Racer, MD 01/05/15 [id number]

## 2015-01-04 NOTE — ED Notes (Addendum)
Pt c/o bilateral lower back pain that radiates around to lower abd area, pain is associated with nausea,vomiting, fever, pt also states that she feels like she is retaining fluid,

## 2015-01-04 NOTE — ED Notes (Signed)
Patient reports lower back pain/flank pain radiating into lower abdomen. Also reports nausea and vomiting throughout the day. States she feels like she hasn't been urinating as much. Denies dysuria. Also reports fevers today of 102.

## 2015-01-05 LAB — CBC WITH DIFFERENTIAL/PLATELET
Basophils Absolute: 0 10*3/uL (ref 0.0–0.1)
Basophils Relative: 0 % (ref 0–1)
EOS ABS: 0.1 10*3/uL (ref 0.0–0.7)
EOS PCT: 1 % (ref 0–5)
HCT: 37.3 % (ref 36.0–46.0)
Hemoglobin: 12.5 g/dL (ref 12.0–15.0)
Lymphocytes Relative: 16 % (ref 12–46)
Lymphs Abs: 0.9 10*3/uL (ref 0.7–4.0)
MCH: 29.9 pg (ref 26.0–34.0)
MCHC: 33.5 g/dL (ref 30.0–36.0)
MCV: 89.2 fL (ref 78.0–100.0)
Monocytes Absolute: 0.4 10*3/uL (ref 0.1–1.0)
Monocytes Relative: 6 % (ref 3–12)
NEUTROS ABS: 4.5 10*3/uL (ref 1.7–7.7)
NEUTROS PCT: 77 % (ref 43–77)
PLATELETS: 152 10*3/uL (ref 150–400)
RBC: 4.18 MIL/uL (ref 3.87–5.11)
RDW: 13.4 % (ref 11.5–15.5)
WBC: 5.8 10*3/uL (ref 4.0–10.5)

## 2015-01-05 LAB — COMPREHENSIVE METABOLIC PANEL
ALT: 18 U/L (ref 0–35)
ANION GAP: 7 (ref 5–15)
AST: 17 U/L (ref 0–37)
Albumin: 3.4 g/dL — ABNORMAL LOW (ref 3.5–5.2)
Alkaline Phosphatase: 66 U/L (ref 39–117)
BUN: 11 mg/dL (ref 6–23)
CO2: 25 mmol/L (ref 19–32)
CREATININE: 0.67 mg/dL (ref 0.50–1.10)
Calcium: 8 mg/dL — ABNORMAL LOW (ref 8.4–10.5)
Chloride: 105 mmol/L (ref 96–112)
GFR calc non Af Amer: >90 mL/min (ref >90)
Glucose, Bld: 126 mg/dL — ABNORMAL HIGH (ref 70–99)
Potassium: 3.4 mmol/L — ABNORMAL LOW (ref 3.5–5.1)
Sodium: 137 mmol/L (ref 135–145)
TOTAL PROTEIN: 6.5 g/dL (ref 6.0–8.3)
Total Bilirubin: 0.3 mg/dL (ref 0.3–1.2)

## 2015-01-05 LAB — LIPASE, BLOOD: Lipase: 18 U/L (ref 11–59)

## 2015-01-05 MED ORDER — METHOCARBAMOL 500 MG PO TABS: 1000.0000 mg | ORAL_TABLET | Freq: Three times a day (TID) | ORAL | Status: DC | PRN

## 2015-01-05 MED ORDER — FAMOTIDINE 20 MG PO TABS: 20.0000 mg | ORAL_TABLET | Freq: Two times a day (BID) | ORAL | Status: DC

## 2015-01-05 MED ORDER — ONDANSETRON 4 MG PO TBDP
ORAL_TABLET | ORAL | Status: DC
Start: 2015-01-05 — End: 2016-03-05

## 2015-01-05 NOTE — ED Notes (Signed)
Pt tolerated water well

## 2015-01-05 NOTE — Discharge Instructions (Signed)
Back Pain, Adult Low back pain is very common. About 1 in 5 people have back pain.The cause of low back pain is rarely dangerous. The pain often gets better over time.About half of people with a sudden onset of back pain feel better in just 2 weeks. About 8 in 10 people feel better by 6 weeks.  CAUSES Some common causes of back pain include:  Strain of the muscles or ligaments supporting the spine.  Wear and tear (degeneration) of the spinal discs.  Arthritis.  Direct injury to the back. DIAGNOSIS Most of the time, the direct cause of low back pain is not known.However, back pain can be treated effectively even when the exact cause of the pain is unknown.Answering your caregiver's questions about your overall health and symptoms is one of the most accurate ways to make sure the cause of your pain is not dangerous. If your caregiver needs more information, he or she may order lab work or imaging tests (X-rays or MRIs).However, even if imaging tests show changes in your back, this usually does not require surgery. HOME CARE INSTRUCTIONS For many people, back pain returns.Since low back pain is rarely dangerous, it is often a condition that people can learn to [facility] their own.   Remain active. It is stressful on the back to sit or stand in one place. Do not sit, drive, or stand in one place for more than 30 minutes at a time. Take short walks on level surfaces as soon as pain allows.Try to increase the length of time you walk each day.  Do not stay in bed.Resting more than 1 or 2 days can delay your recovery.  Do not avoid exercise or work.Your body is made to move.It is not dangerous to be active, even though your back may hurt.Your back will likely heal faster if you return to being active before your pain is gone.  Pay attention to your body when you bend and lift. Many people have less discomfortwhen lifting if they bend their knees, keep the load close to their bodies,and  avoid twisting. Often, the most comfortable positions are those that put less stress on your recovering back.  Find a comfortable position to sleep. Use a firm mattress and lie on your side with your knees slightly bent. If you lie on your back, put a pillow under your knees.  Only take over-the-counter or prescription medicines as directed by your caregiver. Over-the-counter medicines to reduce pain and inflammation are often the most helpful.Your caregiver may prescribe muscle relaxant drugs.These medicines help dull your pain so you can more quickly return to your normal activities and healthy exercise.  Put ice on the injured area.  Put ice in a plastic bag.  Place a towel between your skin and the bag.  Leave the ice on for 15-20 minutes, 03-04 times a day for the first 2 to 3 days. After that, ice and heat may be alternated to reduce pain and spasms.  Ask your caregiver about trying back exercises and gentle massage. This may be of some benefit.  Avoid feeling anxious or stressed.Stress increases muscle tension and can worsen back pain.It is important to recognize when you are anxious or stressed and learn ways to manage it.Exercise is a great option. SEEK MEDICAL CARE IF:  You have pain that is not relieved with rest or medicine.  You have pain that does not improve in 1 week.  You have new symptoms.  You are generally not feeling well. SEEK  IMMEDIATE MEDICAL CARE IF:   You have pain that radiates from your back into your legs.  You develop new bowel or bladder control problems.  You have unusual weakness or numbness in your arms or legs.  You develop nausea or vomiting.  You develop abdominal pain.  You feel faint. Document Released: 08/31/2005 Document Revised: 03/01/2012 Document Reviewed: 01/02/2014 [facility] Patient Information 2015 [place], [place]. This information is not intended to replace advice given to you by your health care provider. Make sure you  discuss any questions you have with your health care provider.  Nausea and Vomiting Nausea means you feel sick to your stomach. Throwing up (vomiting) is a reflex where stomach contents come out of your mouth. HOME CARE   Take medicine as told by your doctor.  Do not force yourself to eat. However, you do need to drink fluids.  If you feel like eating, eat a normal diet as told by your doctor.  Eat rice, wheat, potatoes, bread, lean meats, yogurt, fruits, and vegetables.  Avoid high-fat foods.  Drink enough fluids to keep your pee (urine) clear or pale yellow.  Ask your doctor how to replace body fluid losses (rehydrate). Signs of body fluid loss (dehydration) include:  Feeling very thirsty.  Dry lips and mouth.  Feeling dizzy.  Dark pee.  Peeing less than normal.  Feeling confused.  Fast breathing or heart rate. GET HELP RIGHT AWAY IF:   You have blood in your throw up.  You have black or bloody poop (stool).  You have a bad headache or stiff neck.  You feel confused.  You have bad belly (abdominal) pain.  You have chest pain or trouble breathing.  You do not pee at least once every 8 hours.  You have cold, clammy skin.  You keep throwing up after 24 to 48 hours.  You have a fever. MAKE SURE YOU:   Understand these instructions.  Will watch your condition.  Will get help right away if you are not doing well or get worse. Document Released: 02/17/2008 Document Revised: 11/23/2011 Document Reviewed: 01/30/2011 [facility] Patient Information 2015 [place], [place]. This information is not intended to replace advice given to you by your health care provider. Make sure you discuss any questions you have with your health care provider. Gastritis, Adult Gastritis is soreness and swelling (inflammation) of the lining of the stomach. Gastritis can develop as a sudden onset (acute) or long-term (chronic) condition. If gastritis is not treated, it can lead to stomach  bleeding and ulcers. CAUSES  Gastritis occurs when the stomach lining is weak or damaged. Digestive juices from the stomach then inflame the weakened stomach lining. The stomach lining may be weak or damaged due to viral or bacterial infections. One common bacterial infection is the Helicobacter pylori infection. Gastritis can also result from excessive alcohol consumption, taking certain medicines, or having too much acid in the stomach.  SYMPTOMS  In some cases, there are no symptoms. When symptoms are present, they may include:  Pain or a burning sensation in the upper abdomen.  Nausea.  Vomiting.  An uncomfortable feeling of fullness after eating. DIAGNOSIS  Your caregiver may suspect you have gastritis based on your symptoms and a physical exam. To determine the cause of your gastritis, your caregiver may perform the following:  Blood or stool tests to check for the H pylori bacterium.  Gastroscopy. A thin, flexible tube (endoscope) is passed down the esophagus and into the stomach. The endoscope has a  light and camera on the end. Your caregiver uses the endoscope to view the inside of the stomach.  Taking a tissue sample (biopsy) from the stomach to examine under a microscope. TREATMENT  Depending on the cause of your gastritis, medicines may be prescribed. If you have a bacterial infection, such as an H pylori infection, antibiotics may be given. If your gastritis is caused by too much acid in the stomach, H2 blockers or antacids may be given. Your caregiver may recommend that you stop taking aspirin, ibuprofen, or other nonsteroidal anti-inflammatory drugs (NSAIDs). HOME CARE INSTRUCTIONS  Only take over-the-counter or prescription medicines as directed by your caregiver.  If you were given antibiotic medicines, take them as directed. Finish them even if you start to feel better.  Drink enough fluids to keep your urine clear or pale yellow.  Avoid foods and drinks that make  your symptoms worse, such as:  Caffeine or alcoholic drinks.  Chocolate.  Peppermint or mint flavorings.  Garlic and onions.  Spicy foods.  Citrus fruits, such as oranges, lemons, or limes.  Tomato-based foods such as sauce, chili, salsa, and pizza.  Fried and fatty foods.  Eat small, frequent meals instead of large meals. SEEK IMMEDIATE MEDICAL CARE IF:   You have black or dark red stools.  You vomit blood or material that looks like coffee grounds.  You are unable to keep fluids down.  Your abdominal pain gets worse.  You have a fever.  You do not feel better after 1 week.  You have any other questions or concerns. MAKE SURE YOU:  Understand these instructions.  Will watch your condition.  Will get help right away if you are not doing well or get worse. Document Released: 08/25/2001 Document Revised: 03/01/2012 Document Reviewed: 10/14/2011 [facility] Patient Information 2015 [place], [place]. This information is not intended to replace advice given to you by your health care provider. Make sure you discuss any questions you have with your health care provider.

## 2015-01-05 NOTE — ED Notes (Signed)
Pt up to restroom. Pt given water to drink.

## 2015-02-11 ENCOUNTER — Emergency Department (HOSPITAL_COMMUNITY)
Admission: EM | Admit: 2015-02-11 | Discharge: 2015-02-11 | Disposition: A | Discharge status: Home / Self Care | Payer: Medicaid Other | Attending: Emergency Medicine | Admitting: Emergency Medicine

## 2015-02-11 ENCOUNTER — Emergency Department (HOSPITAL_COMMUNITY): Payer: Medicaid Other

## 2015-02-11 ENCOUNTER — Encounter (HOSPITAL_COMMUNITY): Payer: Self-pay | Admitting: Emergency Medicine

## 2015-02-11 DIAGNOSIS — Z720 Tobacco use: Secondary | ICD-10-CM | POA: Insufficient documentation

## 2015-02-11 DIAGNOSIS — Z880 Allergy status to penicillin: Secondary | ICD-10-CM | POA: Insufficient documentation

## 2015-02-11 DIAGNOSIS — Z8659 Personal history of other mental and behavioral disorders: Secondary | ICD-10-CM | POA: Insufficient documentation

## 2015-02-11 DIAGNOSIS — I10 Essential (primary) hypertension: Secondary | ICD-10-CM | POA: Insufficient documentation

## 2015-02-11 DIAGNOSIS — Y9389 Activity, other specified: Secondary | ICD-10-CM | POA: Insufficient documentation

## 2015-02-11 DIAGNOSIS — Y92008 Other place in unspecified non-institutional (private) residence as the place of occurrence of the external cause: Secondary | ICD-10-CM | POA: Insufficient documentation

## 2015-02-11 DIAGNOSIS — Z87448 Personal history of other diseases of urinary system: Secondary | ICD-10-CM | POA: Insufficient documentation

## 2015-02-11 DIAGNOSIS — Z7982 Long term (current) use of aspirin: Secondary | ICD-10-CM | POA: Insufficient documentation

## 2015-02-11 DIAGNOSIS — W01190A Fall on same level from slipping, tripping and stumbling with subsequent striking against furniture, initial encounter: Secondary | ICD-10-CM | POA: Insufficient documentation

## 2015-02-11 DIAGNOSIS — Z862 Personal history of diseases of the blood and blood-forming organs and certain disorders involving the immune mechanism: Secondary | ICD-10-CM | POA: Insufficient documentation

## 2015-02-11 DIAGNOSIS — Z79899 Other long term (current) drug therapy: Secondary | ICD-10-CM | POA: Insufficient documentation

## 2015-02-11 DIAGNOSIS — Z8742 Personal history of other diseases of the female genital tract: Secondary | ICD-10-CM | POA: Insufficient documentation

## 2015-02-11 DIAGNOSIS — Z8639 Personal history of other endocrine, nutritional and metabolic disease: Secondary | ICD-10-CM | POA: Insufficient documentation

## 2015-02-11 DIAGNOSIS — S20212A Contusion of left front wall of thorax, initial encounter: Secondary | ICD-10-CM | POA: Insufficient documentation

## 2015-02-11 DIAGNOSIS — Y998 Other external cause status: Secondary | ICD-10-CM | POA: Insufficient documentation

## 2015-02-11 MED ORDER — MELOXICAM 15 MG PO TABS: 15.0000 mg | ORAL_TABLET | Freq: Every day | ORAL | Status: DC

## 2015-02-11 MED ORDER — HYDROCODONE-ACETAMINOPHEN 5-325 MG PO TABS: 1.0000 | ORAL_TABLET | ORAL | Status: DC | PRN

## 2015-02-11 MED ORDER — ALBUTEROL SULFATE HFA 108 (90 BASE) MCG/ACT IN AERS
2.0000 | INHALATION_SPRAY | Freq: Once | RESPIRATORY_TRACT | Status: AC
Start: 2015-02-11 — End: 2015-02-11
  Administered 2015-02-11: 2 via RESPIRATORY_TRACT
  Filled 2015-02-11: qty 6.7

## 2015-02-11 MED ORDER — PREDNISONE 50 MG PO TABS
60.0000 mg | ORAL_TABLET | Freq: Once | ORAL | Status: AC
  Administered 2015-02-11: 60 mg via ORAL
  Filled 2015-02-11 (×2): qty 1

## 2015-02-11 MED ORDER — HYDROCODONE-ACETAMINOPHEN 5-325 MG PO TABS
2.0000 | ORAL_TABLET | Freq: Once | ORAL | Status: AC
  Administered 2015-02-11: 2 via ORAL
  Filled 2015-02-11: qty 2

## 2015-02-11 MED ORDER — DEXAMETHASONE 4 MG PO TABS: 4.0000 mg | ORAL_TABLET | Freq: Two times a day (BID) | ORAL | Status: DC

## 2015-02-11 MED ORDER — METHOCARBAMOL 500 MG PO TABS
500.0000 mg | ORAL_TABLET | Freq: Once | ORAL | Status: AC
  Administered 2015-02-11: 500 mg via ORAL
  Filled 2015-02-11: qty 1

## 2015-02-11 MED ORDER — METHOCARBAMOL 500 MG PO TABS: 500.0000 mg | ORAL_TABLET | Freq: Three times a day (TID) | ORAL | Status: DC

## 2015-02-11 NOTE — ED Notes (Signed)
Pt states that she tripped last night and hit the arm of the couch with her left ribs.

## 2015-02-11 NOTE — ED Provider Notes (Signed)
CSN: [id number]     Arrival date & time 02/11/15  0918 History   First MD Initiated Contact with Patient 02/11/15 1024     Chief Complaint  Patient presents with  . Rib Injury     (Consider location/radiation/quality/duration/timing/severity/associated sxs/prior Treatment) HPI Comments: Patient is a 34 year old female who presents to the emergency department with complaint of left rib and chest area pain.  The patient states that on last evening she tripped, fell and hit her left chest and ribs on a couch. She states that she heard a pop, and since that time she has pain with taking a deep breath. It is of note that before this fall she had been doing some coughing. She states that she has a upper respiratory infection and she is a smoker. She is not been coughing up any blood since the fall. She does however have pain when she laughs, coughs, sneezes, or takes a deep breath. Patient denies being on any anticoagulation medications. She's not had any previous operations or procedures involving the chest.  Patient is a 34 y.o. female presenting with chest pain. The history is provided by the patient.  Chest Pain Associated symptoms: cough     Past Medical History  Diagnosis Date  . MVP (mitral valve prolapse)   . Seizures 05/2013    potassium was low, blood sugar was low  . Depression     not treated at present time  . Anxiety     not treated at present time  . Kidney infection     has had several  . Headache(784.0)     gets headache with menstrual period  . Cervical dysplasia   . Anemia     "low iron", anemic as a child  . Polycystic ovary syndrome   . Hypertension    Past Surgical History  Procedure Laterality Date  . Diagnostic laparoscopy    . Orif ankle fracture Right 09/08/2013    Procedure: OPEN REDUCTION INTERNAL FIXATION (ORIF) RIGHT ANKLE FRACTURE;  Surgeon: Cheral AlmasNaiping Michael Xu, MD;  Location: [facility] OR;  Service: Orthopedics;  Laterality: Right;   Family History    Problem Relation Age of Onset  . Mitral valve prolapse Mother   . Hypertension Mother   . COPD Father   . Diabetes type II Father   . Pancreatitis Father   . Heart disease Father   . ADD / ADHD Son   . Asthma Son   . Hypertension Maternal Grandmother   . Mitral valve prolapse Maternal Grandmother   . Cancer Paternal Grandmother   . ADD / ADHD Son   . Heart disease Son    History  Substance Use Topics  . Smoking status: Current Some Day Smoker -- 0.50 packs/day    Types: Cigarettes  . Smokeless tobacco: Never Used  . Alcohol Use: No   OB History    Gravida Para Term Preterm AB TAB SAB Ectopic Multiple Living   5 2  2 2  2   2      Review of Systems  Constitutional: Negative for chills, activity change and appetite change.  HENT: Positive for congestion.   Respiratory: Positive for cough.   Cardiovascular: Positive for chest pain.      Allergies  Tape; Keflex; Penicillins; and Sulfa antibiotics  Home Medications   Prior to Admission medications   Medication Sig Start Date End Date Taking? Authorizing Provider  guaifenesin (ROBITUSSIN) 100 MG/5ML syrup Take 200 mg by mouth 3 (three) times daily as needed  for cough.   Yes Historical Provider, MD  aspirin EC 325 MG tablet Take 1 tablet (325 mg total) by mouth 2 (two) times daily. Patient not taking: Reported on 12/14/2014 09/08/13   Tarry Kos, MD  famotidine (PEPCID) 20 MG tablet Take 1 tablet (20 mg total) by mouth 2 (two) times daily. Patient not taking: Reported on 02/11/2015 01/05/15   Loren Racer, MD  furosemide (LASIX) 20 MG tablet Take 1 tablet (20 mg total) by mouth daily. Patient not taking: Reported on 02/11/2015 12/14/14   Bethann Berkshire, MD  HYDROcodone-acetaminophen (NORCO/VICODIN) 5-325 MG per tablet Take 1 tablet by mouth every 6 (six) hours as needed. Patient not taking: Reported on 02/11/2015 12/14/14   Bethann Berkshire, MD  methocarbamol (ROBAXIN) 500 MG tablet Take 2 tablets (1,000 mg total) by mouth every  8 (eight) hours as needed for muscle spasms. Patient not taking: Reported on 02/11/2015 01/05/15   Loren Racer, MD  ondansetron (ZOFRAN ODT) 4 MG disintegrating tablet  ODT q4 hours prn nausea/vomit Patient not taking: Reported on 02/11/2015 01/05/15   Loren Racer, MD  oxyCODONE (OXY IR/ROXICODONE) 5 MG immediate release tablet Take 1-3 tablets (5-15 mg total) by mouth every 4 (four) hours as needed. Patient not taking: Reported on 12/14/2014 09/08/13   Tarry Kos, MD   BP 108/60 mmHg  Pulse 83  Temp(Src) 98.2 F (36.8 C)  Resp 18  Ht  (1.549 m)  Wt 180 lb (81.647 kg)  BMI 34.03 kg/m2  SpO2 98%  LMP 01/16/2015 Physical Exam  Constitutional: She is oriented to person, place, and time. She appears well-developed and well-nourished.  Non-toxic appearance.  HENT:  Head: Normocephalic.  Right Ear: Tympanic membrane and external ear normal.  Left Ear: Tympanic membrane and external ear normal.  Eyes: EOM and lids are normal. Pupils are equal, round, and reactive to light.  Neck: Normal range of motion. Neck supple. Carotid bruit is not present.  Cardiovascular: Normal rate, regular rhythm, normal heart sounds, intact distal pulses and normal pulses.   Pulmonary/Chest: No accessory muscle usage. No respiratory distress. She has wheezes. She has rhonchi. She exhibits tenderness and bony tenderness. She exhibits no deformity and no retraction.    Abdominal: Soft. Bowel sounds are normal. There is no tenderness. There is no guarding.  Musculoskeletal: Normal range of motion.  Lymphadenopathy:       Head (right side): No submandibular adenopathy present.       Head (left side): No submandibular adenopathy present.    She has no cervical adenopathy.  Neurological: She is alert and oriented to person, place, and time. She has normal strength. No cranial nerve deficit or sensory deficit.  Skin: Skin is warm and dry.  Psychiatric: She has a normal mood and affect. Her speech is  normal.  Nursing note and vitals reviewed.   ED Course  Procedures (including critical care time) Labs Review Labs Reviewed - No data to display  Imaging Review Dg Ribs Unilateral W/chest Left  02/11/2015   CLINICAL DATA:  Status post fall 02/11/2015 with a blow to the left side of the chest. Pain and cough. Initial encounter.  EXAM: LEFT RIBS AND CHEST - 3+ VIEW  COMPARISON:  PA and lateral chest 09/08/2013.  FINDINGS: The lungs are clear. Heart size is normal. No pneumothorax or pleural effusion. No rib fracture is identified.  IMPRESSION: Negative exam.   Electronically Signed   By: Drusilla Kanner M.D.   On: 02/11/2015 10:22  EKG Interpretation None      MDM  Vital signs are well within normal limits. Pulse oximetry is 98% on room air. X-ray of the chest and ribs is negative for pneumothorax, mass, or rib fracture.  The patient will be treated with a incentive spirometer, albuterol inhaler, Decadron, Robaxin, and Norco. The patient is to follow with her Medicare access physician if not improving.    Final diagnoses:  None    *I have reviewed nursing notes, vital signs, and all appropriate lab and imaging results for this patient.Ivery Quale, PA-C 02/11/15 1225  Samuel Jester, DO 02/14/15 2009

## 2015-02-11 NOTE — Discharge Instructions (Signed)
Your xray is negative for fracture or dislocation. Please use the incentive device four times daily. Use inhaler 2 puffs every 4 hours. Use medications as suggested. See your Medicaid Access MD next week for follow up and recheck. Robaxin and Norco may cause drowsiness, use with caution. Chest Contusion A contusion is a deep bruise. Bruises happen when an injury causes bleeding under the skin. Signs of bruising include pain, puffiness (swelling), and discolored skin. The bruise may turn blue, purple, or yellow.  HOME CARE  Put ice on the injured area.  Put ice in a plastic bag.  Place a towel between the skin and the bag.  Leave the ice on for 15-20 minutes at a time, 03-04 times a day for the first 48 hours.  Only take medicine as told by your doctor.  Rest.  Take deep breaths (deep-breathing exercises) as told by your doctor.  Stop smoking if you smoke.  Do not lift objects over 5 pounds (2.3 kilograms) for 3 days or longer if told by your doctor. GET HELP RIGHT AWAY IF:   You have more bruising or puffiness.  You have pain that gets worse.  You have trouble breathing.  You are dizzy, weak, or pass out (faint).  You have blood in your pee (urine) or poop (stool).  You cough up or throw up (vomit) blood.  Your puffiness or pain is not helped with medicines. MAKE SURE YOU:   Understand these instructions.  Will watch your condition.  Will get help right away if you are not doing well or get worse. Document Released: 02/17/2008 Document Revised: 05/25/2012 Document Reviewed: 02/22/2012 [facility] Patient Information 2015 [place], [place]. This information is not intended to replace advice given to you by your health care provider. Make sure you discuss any questions you have with your health care provider.

## 2015-06-25 ENCOUNTER — Ambulatory Visit (INDEPENDENT_AMBULATORY_CARE_PROVIDER_SITE_OTHER): Payer: Medicaid Other | Admitting: Adult Health

## 2015-06-25 ENCOUNTER — Encounter: Payer: Self-pay | Admitting: Adult Health

## 2015-06-25 ENCOUNTER — Other Ambulatory Visit: Payer: Self-pay | Admitting: Adult Health

## 2015-06-25 VITALS — BP 120/78 | HR 64 | Ht 61.5 in | Wt 169.0 lb

## 2015-06-25 DIAGNOSIS — Z3490 Encounter for supervision of normal pregnancy, unspecified, unspecified trimester: Secondary | ICD-10-CM

## 2015-06-25 DIAGNOSIS — Z3201 Encounter for pregnancy test, result positive: Secondary | ICD-10-CM | POA: Diagnosis not present

## 2015-06-25 DIAGNOSIS — Z87442 Personal history of urinary calculi: Secondary | ICD-10-CM

## 2015-06-25 DIAGNOSIS — Z86711 Personal history of pulmonary embolism: Secondary | ICD-10-CM | POA: Insufficient documentation

## 2015-06-25 DIAGNOSIS — R319 Hematuria, unspecified: Secondary | ICD-10-CM

## 2015-06-25 DIAGNOSIS — Z87440 Personal history of urinary (tract) infections: Secondary | ICD-10-CM

## 2015-06-25 DIAGNOSIS — O3680X0 Pregnancy with inconclusive fetal viability, not applicable or unspecified: Secondary | ICD-10-CM

## 2015-06-25 HISTORY — DX: Encounter for supervision of normal pregnancy, unspecified, unspecified trimester: Z34.90

## 2015-06-25 HISTORY — DX: Personal history of urinary (tract) infections: Z87.440

## 2015-06-25 LAB — POCT URINALYSIS DIPSTICK: GLUCOSE UA: NEGATIVE

## 2015-06-25 LAB — POCT URINE PREGNANCY: Preg Test, Ur: POSITIVE — AB

## 2015-06-25 MED ORDER — CITRANATAL B-CALM 20-1 MG & 2 X 25 MG PO MISC: ORAL | Status: DC

## 2015-06-25 MED ORDER — NITROFURANTOIN MONOHYD MACRO 100 MG PO CAPS: 100.0000 mg | ORAL_CAPSULE | Freq: Two times a day (BID) | ORAL | Status: DC

## 2015-06-25 NOTE — Addendum Note (Signed)
Addended by: Colen Darling on: 06/25/2015 12:45 PM   Modules accepted: Orders

## 2015-06-25 NOTE — Progress Notes (Addendum)
Subjective:     Patient ID: Lauren Durham, female   DOB: Sep 29, 1980, 34 y.o.   MRN: [id number]  HPI Floris is a 34 year old white female, G5P3 in for UPT, she had +HPT,she complains of burning with urination and pain in right upper back, has had for about 2 months.She has a history of UTI,pyleo and kidney stones.She says her daughter just turned 1, 9/19.She was delivered at [facility], while she was in prison, and she had an abruption at 26 weeks but delivered at term, and then had PE after delivery and was on lovenox.She has some nausea and heartburn.She wants tubes tied while in hospital.  Review of Systems Patient denies any headaches, hearing loss, fatigue, blurred vision, shortness of breath, chest pain, problems with bowel movements,or intercourse. No joint pain or mood swings.See HPI for positives.  Reviewed past medical,surgical, social and family history. Reviewed medications and allergies.     Objective:   Physical Exam BP 120/78 mmHg  Pulse 64  Ht 5' 1.5" (1.562 m)  Wt 169 lb (76.658 kg)  BMI 31.42 kg/m2  LMP 04/28/2015 UPT +, urine dipstick 1+ leuks,trace protein,trace blood,Skin warm and dry. Lungs: clear to ausculation bilaterally. Cardiovascular: regular rate and rhythm.+CVAT on right.She is about 8+2 weeks with EDD 02/02/16 by LMP, medicaid form given. Will get renal US and dating [place] in am.Discussed not taking oxycodone,Ok to take zofran if needed, but I would rather she try eating often and pushing fluids,do not take AZO.   Discussed with Dr Despina Hidden.  Assessment:     Pregnant  History of UTI History PE Hematuria History of kidney stone    Plan:    Request records from [facility] and [place] [facility] and dating [place] 10/12 at 1:15 pm at [facility] talk tomorrow after results back Rx macrobid 1 bid x 7 days Rx citranatal B calm #90 take 1 every 8 hour with 6 refills UA C&S sent Return in 1 week for new OB   Push water and lemonade Decrease soda and cigarettes  Eat every 2 hours   Review handout on first tirmester

## 2015-06-25 NOTE — Patient Instructions (Signed)
First Trimester of Pregnancy The first trimester of pregnancy is from week 1 until the end of week 12 (months 1 through 3). A week after a sperm fertilizes an egg, the egg will implant on the wall of the uterus. This embryo will begin to develop into a baby. Genes from you and your partner are forming the baby. The female genes determine whether the baby is a boy or a girl. At 6-8 weeks, the eyes and face are formed, and the heartbeat can be seen on ultrasound. At the end of 12 weeks, all the baby's organs are formed.  Now that you are pregnant, you will want to do everything you can to have a healthy baby. Two of the most important things are to get good prenatal care and to follow your health care provider's instructions. Prenatal care is all the medical care you receive before the baby's birth. This care will help prevent, find, and treat any problems during the pregnancy and childbirth. BODY CHANGES Your body goes through many changes during pregnancy. The changes vary from woman to woman.   You may gain or lose a couple of pounds at first.  You may feel sick to your stomach (nauseous) and throw up (vomit). If the vomiting is uncontrollable, call your health care provider.  You may tire easily.  You may develop headaches that can be relieved by medicines approved by your health care provider.  You may urinate more often. Painful urination may mean you have a bladder infection.  You may develop heartburn as a result of your pregnancy.  You may develop constipation because certain hormones are causing the muscles that push waste through your intestines to slow down.  You may develop hemorrhoids or swollen, bulging veins (varicose veins).  Your breasts may begin to grow larger and become tender. Your nipples may stick out more, and the tissue that surrounds them (areola) may become darker.  Your gums may bleed and may be sensitive to brushing and flossing.  Dark spots or blotches (chloasma,  mask of pregnancy) may develop on your face. This will likely fade after the baby is born.  Your menstrual periods will stop.  You may have a loss of appetite.  You may develop cravings for certain kinds of food.  You may have changes in your emotions from day to day, such as being excited to be pregnant or being concerned that something may go wrong with the pregnancy and baby.  You may have more vivid and strange dreams.  You may have changes in your hair. These can include thickening of your hair, rapid growth, and changes in texture. Some women also have hair loss during or after pregnancy, or hair that feels dry or thin. Your hair will most likely return to normal after your baby is born. WHAT TO EXPECT AT YOUR PRENATAL VISITS During a routine prenatal visit:  You will be weighed to make sure you and the baby are growing normally.  Your blood pressure will be taken.  Your abdomen will be measured to track your baby's growth.  The fetal heartbeat will be listened to starting around week 10 or 12 of your pregnancy.  Test results from any previous visits will be discussed. Your health care provider may ask you:  How you are feeling.  If you are feeling the baby move.  If you have had any abnormal symptoms, such as leaking fluid, bleeding, severe headaches, or abdominal cramping.  If you are using any tobacco products,   including cigarettes, chewing tobacco, and electronic cigarettes.  If you have any questions. Other tests that may be performed during your first trimester include:  Blood tests to find your blood type and to check for the presence of any previous infections. They will also be used to check for low iron levels (anemia) and Rh antibodies. Later in the pregnancy, blood tests for diabetes will be done along with other tests if problems develop.  Urine tests to check for infections, diabetes, or protein in the urine.  An ultrasound to confirm the proper growth  and development of the baby.  An amniocentesis to check for possible genetic problems.  Fetal screens for spina bifida and Down syndrome.  You may need other tests to make sure you and the baby are doing well.  HIV (human immunodeficiency virus) testing. Routine prenatal testing includes screening for HIV, unless you choose not to have this test. HOME CARE INSTRUCTIONS  Medicines  Follow your health care provider's instructions regarding medicine use. Specific medicines may be either safe or unsafe to take during pregnancy.  Take your prenatal vitamins as directed.  If you develop constipation, try taking a stool softener if your health care provider approves. Diet  Eat regular, well-balanced meals. Choose a variety of foods, such as meat or vegetable-based protein, fish, milk and low-fat dairy products, vegetables, fruits, and whole grain breads and cereals. Your health care provider will help you determine the amount of weight gain that is right for you.  Avoid raw meat and uncooked cheese. These carry germs that can cause birth defects in the baby.  Eating four or five small meals rather than three large meals a day may help relieve nausea and vomiting. If you start to feel nauseous, eating a few soda crackers can be helpful. Drinking liquids between meals instead of during meals also seems to help nausea and vomiting.  If you develop constipation, eat more high-fiber foods, such as fresh vegetables or fruit and whole grains. Drink enough fluids to keep your urine clear or pale yellow. Activity and Exercise  Exercise only as directed by your health care provider. Exercising will help you:  Control your weight.  Stay in shape.  Be prepared for labor and delivery.  Experiencing pain or cramping in the lower abdomen or low back is a good sign that you should stop exercising. Check with your health care provider before continuing normal exercises.  Try to avoid standing for long  periods of time. Move your legs often if you must stand in one place for a long time.  Avoid heavy lifting.  Wear low-heeled shoes, and practice good posture.  You may continue to have sex unless your health care provider directs you otherwise. Relief of Pain or Discomfort  Wear a good support bra for breast tenderness.   Take warm sitz baths to soothe any pain or discomfort caused by hemorrhoids. Use hemorrhoid cream if your health care provider approves.   Rest with your legs elevated if you have leg cramps or low back pain.  If you develop varicose veins in your legs, wear support hose. Elevate your feet for 15 minutes, 3-4 times a day. Limit salt in your diet. Prenatal Care  Schedule your prenatal visits by the twelfth week of pregnancy. They are usually scheduled monthly at first, then more often in the last 2 months before delivery.  Write down your questions. Take them to your prenatal visits.  Keep all your prenatal visits as directed by your   health care provider. Safety  Wear your seat belt at all times when driving.  Make a list of emergency phone numbers, including numbers for family, friends, the hospital, and police and fire departments. General Tips  Ask your health care provider for a referral to a local prenatal education class. Begin classes no later than at the beginning of month 6 of your pregnancy.  Ask for help if you have counseling or nutritional needs during pregnancy. Your health care provider can offer advice or refer you to specialists for help with various needs.  Do not use hot tubs, steam rooms, or saunas.  Do not douche or use tampons or scented sanitary pads.  Do not cross your legs for long periods of time.  Avoid cat litter boxes and soil used by cats. These carry germs that can cause birth defects in the baby and possibly loss of the fetus by miscarriage or stillbirth.  Avoid all smoking, herbs, alcohol, and medicines not prescribed by  your health care provider. Chemicals in these affect the formation and growth of the baby.  Do not use any tobacco products, including cigarettes, chewing tobacco, and electronic cigarettes. If you need help quitting, ask your health care provider. You may receive counseling support and other resources to help you quit.  Schedule a dentist appointment. At home, brush your teeth with a soft toothbrush and be gentle when you floss. SEEK MEDICAL CARE IF:   You have dizziness.  You have mild pelvic cramps, pelvic pressure, or nagging pain in the abdominal area.  You have persistent nausea, vomiting, or diarrhea.  You have a bad smelling vaginal discharge.  You have pain with urination.  You notice increased swelling in your face, hands, legs, or ankles. SEEK IMMEDIATE MEDICAL CARE IF:   You have a fever.  You are leaking fluid from your vagina.  You have spotting or bleeding from your vagina.  You have severe abdominal cramping or pain.  You have rapid weight gain or loss.  You vomit blood or material that looks like coffee grounds.  You are exposed to [place] measles and have never had them.  You are exposed to fifth disease or chickenpox.  You develop a severe headache.  You have shortness of breath.  You have any kind of trauma, such as from a fall or a car accident.   This information is not intended to replace advice given to you by your health care provider. Make sure you discuss any questions you have with your health care provider.   Document Released: 08/25/2001 Document Revised: 09/21/2014 Document Reviewed: 07/11/2013 Elsevier Interactive Patient Education 2016 [employer]. Push water Take macrobid Get [place] 10/12 at 1:15 pm at [facility] Follow up in 1 week

## 2015-06-26 ENCOUNTER — Telehealth: Payer: Self-pay | Admitting: Adult Health

## 2015-06-26 ENCOUNTER — Ambulatory Visit (HOSPITAL_COMMUNITY): Admission: RE | Admit: 2015-06-26 | Payer: Medicaid Other | Source: Ambulatory Visit

## 2015-06-26 ENCOUNTER — Ambulatory Visit (HOSPITAL_COMMUNITY)
Admission: RE | Admit: 2015-06-26 | Discharge: 2015-06-26 | Disposition: A | Discharge status: Home / Self Care | Payer: Medicaid Other | Source: Ambulatory Visit | Attending: Adult Health | Admitting: Adult Health

## 2015-06-26 DIAGNOSIS — R319 Hematuria, unspecified: Secondary | ICD-10-CM | POA: Insufficient documentation

## 2015-06-26 DIAGNOSIS — O9989 Other specified diseases and conditions complicating pregnancy, childbirth and the puerperium: Secondary | ICD-10-CM | POA: Diagnosis not present

## 2015-06-26 DIAGNOSIS — O3680X0 Pregnancy with inconclusive fetal viability, not applicable or unspecified: Secondary | ICD-10-CM | POA: Insufficient documentation

## 2015-06-26 DIAGNOSIS — Z87440 Personal history of urinary (tract) infections: Secondary | ICD-10-CM | POA: Insufficient documentation

## 2015-06-26 DIAGNOSIS — Z87442 Personal history of urinary calculi: Secondary | ICD-10-CM

## 2015-06-26 DIAGNOSIS — Z3A00 Weeks of gestation of pregnancy not specified: Secondary | ICD-10-CM | POA: Diagnosis not present

## 2015-06-26 LAB — URINALYSIS, ROUTINE W REFLEX MICROSCOPIC
BILIRUBIN UA: NEGATIVE
Glucose, UA: NEGATIVE
Nitrite, UA: NEGATIVE
PH UA: 7 (ref 5.0–7.5)
RBC UA: NEGATIVE
Specific Gravity, UA: >=1.03 — AB (ref 1.005–1.030)
UUROB: 1 mg/dL (ref 0.2–1.0)

## 2015-06-26 LAB — MICROSCOPIC EXAMINATION: CASTS: NONE SEEN /LPF

## 2015-06-26 NOTE — Telephone Encounter (Signed)
Left message that renal US normal and she is about [redacted] weeks pregnant by [place]

## 2015-06-27 LAB — URINE CULTURE

## 2015-06-28 ENCOUNTER — Telehealth: Payer: Self-pay | Admitting: *Deleted

## 2015-06-28 NOTE — Telephone Encounter (Signed)
Left message culture did not grow anything and renal US normal keep appt

## 2015-07-03 ENCOUNTER — Encounter: Payer: Medicaid Other | Admitting: Women's Health

## 2015-07-03 IMAGING — DX DG RIBS W/ CHEST 3+V*L*
4 series · 4 of 4 positions shown · non-contrast
Comparison: PA and lateral chest 09/08/2013.

CLINICAL DATA: Status post fall 02/11/2015 with a blow to the left
side of the chest. Pain and cough. Initial encounter.

EXAM:
LEFT RIBS AND CHEST - 3+ VIEW

[chest pa]
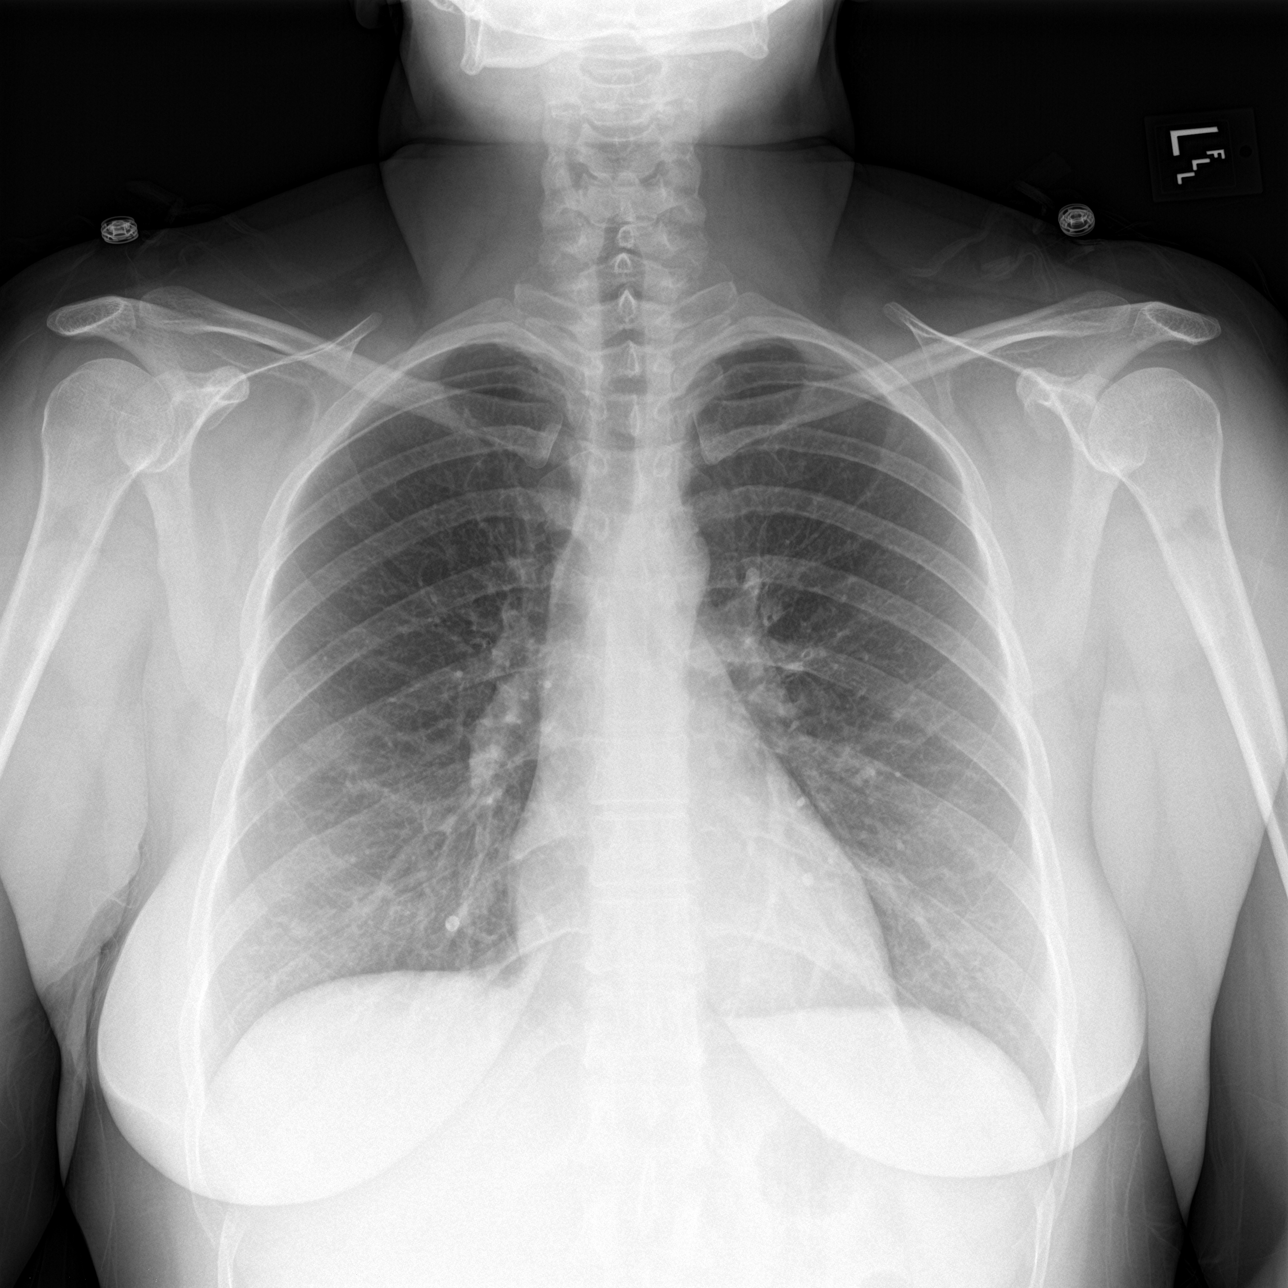

[rib ap]
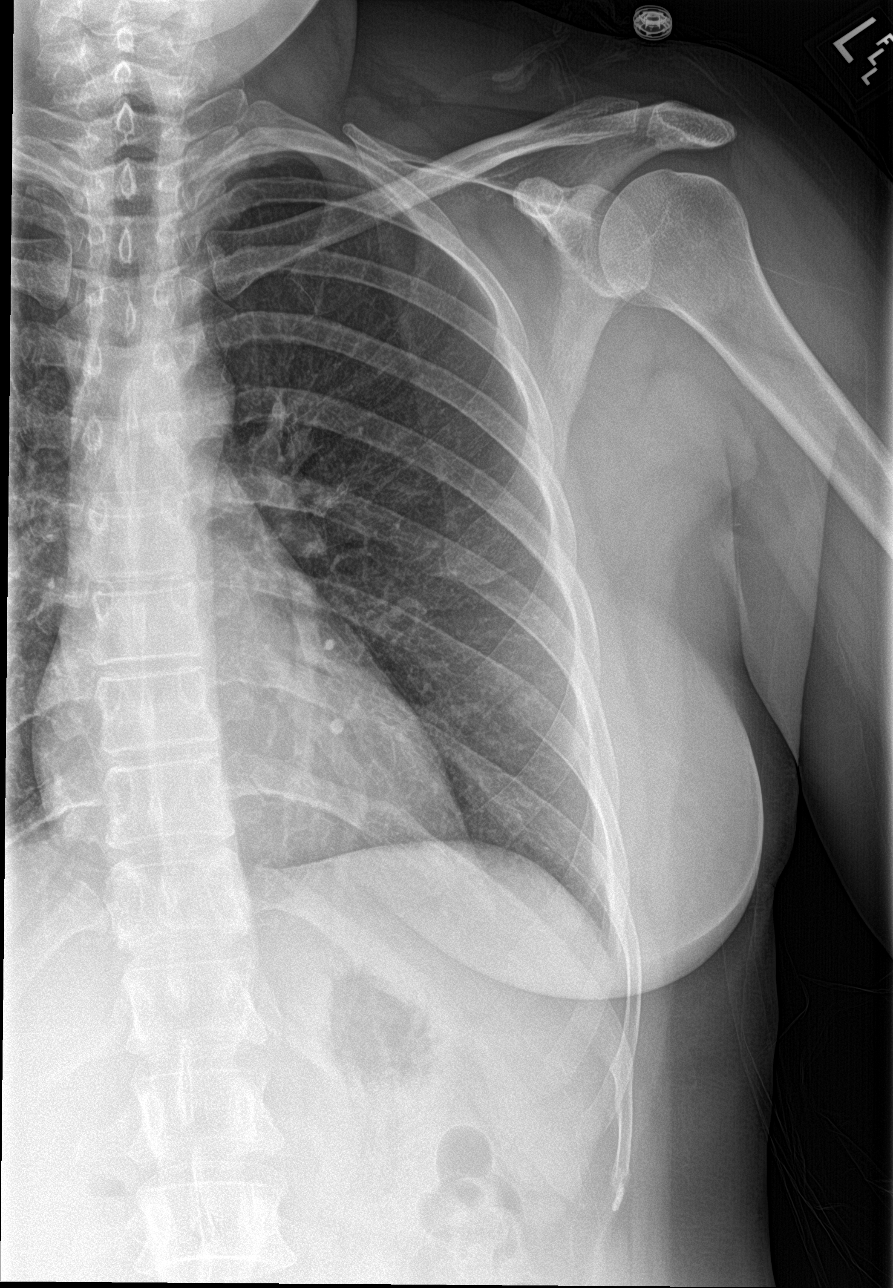

[rib ap obl]
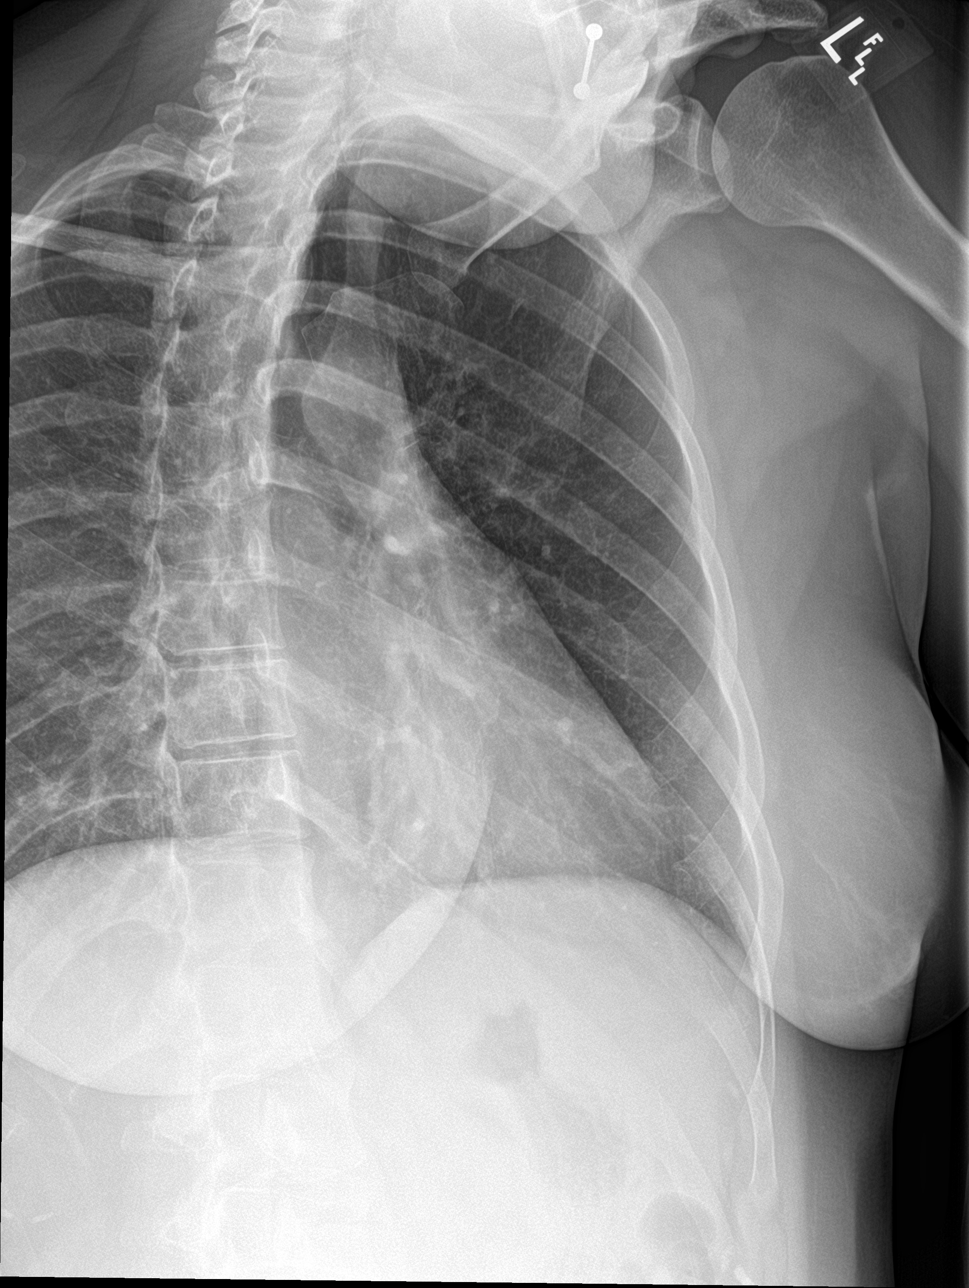

[rib pa obl]
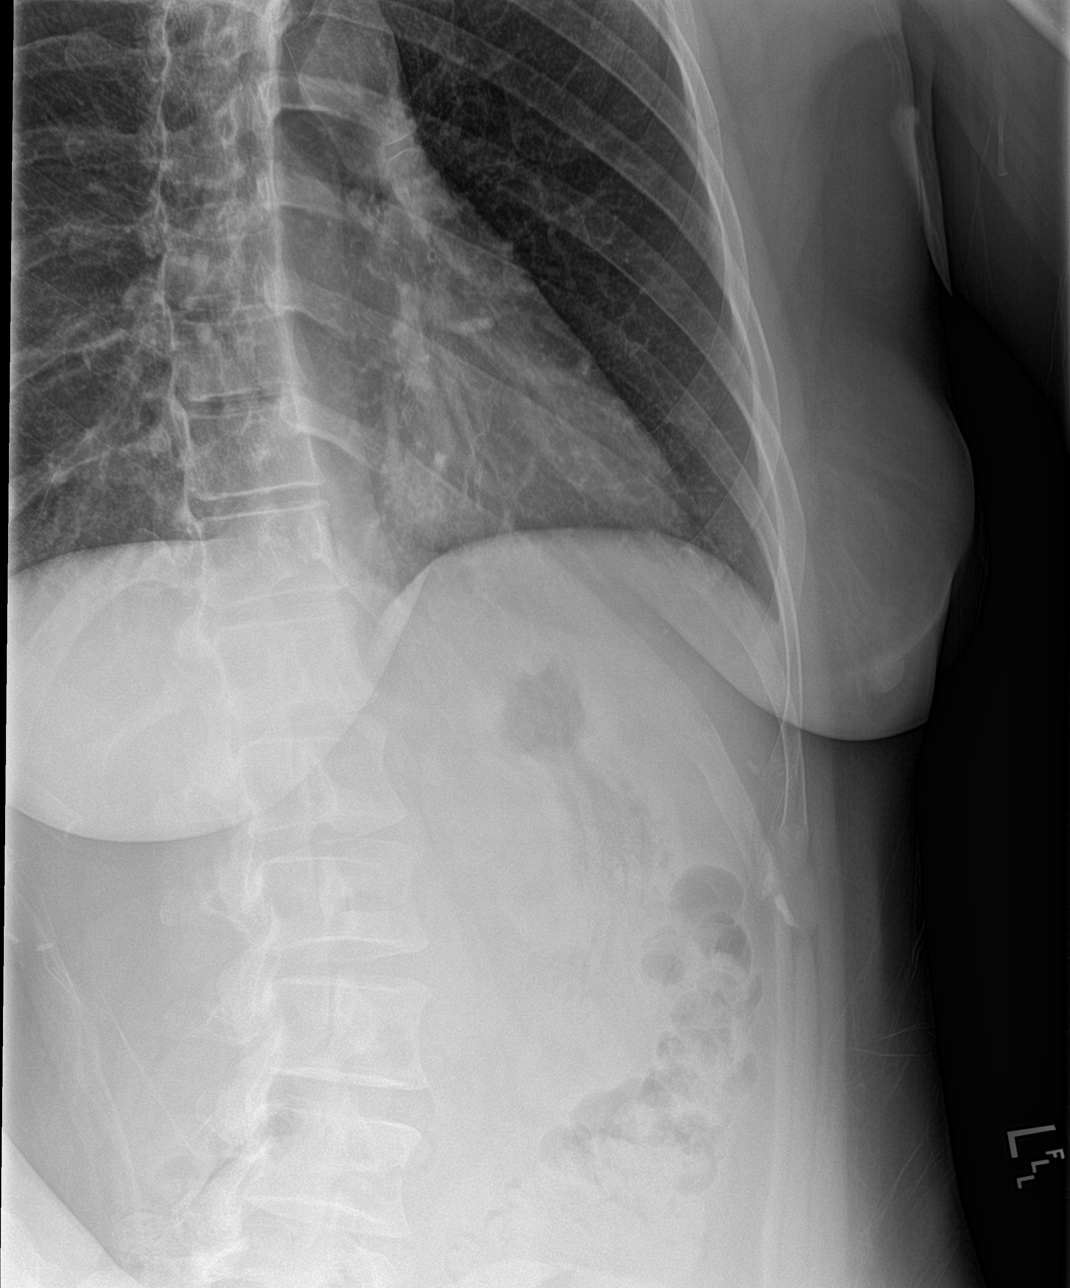

[4 of 4 positions shown; findings below may reference images not displayed]

FINDINGS: The lungs are clear. Heart size is normal. No pneumothorax or
pleural effusion. No rib fracture is identified.
IMPRESSION: Negative exam.

## 2015-07-09 ENCOUNTER — Encounter: Payer: Medicaid Other | Admitting: Adult Health

## 2015-07-22 ENCOUNTER — Encounter: Payer: Self-pay | Admitting: *Deleted

## 2015-07-22 DIAGNOSIS — N12 Tubulo-interstitial nephritis, not specified as acute or chronic: Secondary | ICD-10-CM

## 2015-07-23 ENCOUNTER — Encounter: Payer: Medicaid Other | Admitting: Advanced Practice Midwife

## 2015-07-29 ENCOUNTER — Other Ambulatory Visit (HOSPITAL_COMMUNITY)
Admission: RE | Admit: 2015-07-29 | Discharge: 2015-07-29 | Disposition: A | Discharge status: Home / Self Care | Payer: Medicaid Other | Source: Ambulatory Visit | Attending: Adult Health | Admitting: Adult Health

## 2015-07-29 ENCOUNTER — Encounter: Payer: Self-pay | Admitting: Women's Health

## 2015-07-29 ENCOUNTER — Ambulatory Visit (INDEPENDENT_AMBULATORY_CARE_PROVIDER_SITE_OTHER): Payer: Medicaid Other | Admitting: Women's Health

## 2015-07-29 VITALS — BP 108/72 | HR 76 | Wt 166.0 lb

## 2015-07-29 DIAGNOSIS — Z3491 Encounter for supervision of normal pregnancy, unspecified, first trimester: Secondary | ICD-10-CM | POA: Diagnosis not present

## 2015-07-29 DIAGNOSIS — N898 Other specified noninflammatory disorders of vagina: Secondary | ICD-10-CM

## 2015-07-29 DIAGNOSIS — Z3682 Encounter for antenatal screening for nuchal translucency: Secondary | ICD-10-CM

## 2015-07-29 DIAGNOSIS — O09891 Supervision of other high risk pregnancies, first trimester: Secondary | ICD-10-CM

## 2015-07-29 DIAGNOSIS — F17200 Nicotine dependence, unspecified, uncomplicated: Secondary | ICD-10-CM

## 2015-07-29 DIAGNOSIS — Z0283 Encounter for blood-alcohol and blood-drug test: Secondary | ICD-10-CM

## 2015-07-29 DIAGNOSIS — Z1151 Encounter for screening for human papillomavirus (HPV): Secondary | ICD-10-CM | POA: Insufficient documentation

## 2015-07-29 DIAGNOSIS — Z01419 Encounter for gynecological examination (general) (routine) without abnormal findings: Secondary | ICD-10-CM | POA: Diagnosis present

## 2015-07-29 DIAGNOSIS — Z331 Pregnant state, incidental: Secondary | ICD-10-CM | POA: Diagnosis not present

## 2015-07-29 DIAGNOSIS — A599 Trichomoniasis, unspecified: Secondary | ICD-10-CM | POA: Diagnosis not present

## 2015-07-29 DIAGNOSIS — O26891 Other specified pregnancy related conditions, first trimester: Secondary | ICD-10-CM

## 2015-07-29 DIAGNOSIS — Z23 Encounter for immunization: Secondary | ICD-10-CM

## 2015-07-29 DIAGNOSIS — O165 Unspecified maternal hypertension, complicating the puerperium: Secondary | ICD-10-CM

## 2015-07-29 DIAGNOSIS — O09211 Supervision of pregnancy with history of pre-term labor, first trimester: Secondary | ICD-10-CM

## 2015-07-29 DIAGNOSIS — Z113 Encounter for screening for infections with a predominantly sexual mode of transmission: Secondary | ICD-10-CM | POA: Insufficient documentation

## 2015-07-29 DIAGNOSIS — F1290 Cannabis use, unspecified, uncomplicated: Secondary | ICD-10-CM

## 2015-07-29 DIAGNOSIS — Z1389 Encounter for screening for other disorder: Secondary | ICD-10-CM

## 2015-07-29 DIAGNOSIS — F1210 Cannabis abuse, uncomplicated: Secondary | ICD-10-CM

## 2015-07-29 DIAGNOSIS — Z720 Tobacco use: Secondary | ICD-10-CM

## 2015-07-29 DIAGNOSIS — Z369 Encounter for antenatal screening, unspecified: Secondary | ICD-10-CM

## 2015-07-29 LAB — POCT URINALYSIS DIPSTICK
Blood, UA: NEGATIVE
GLUCOSE UA: NEGATIVE
KETONES UA: NEGATIVE
Nitrite, UA: NEGATIVE
Protein, UA: NEGATIVE

## 2015-07-29 LAB — POCT WET PREP (WET MOUNT): CLUE CELLS WET PREP WHIFF POC: POSITIVE

## 2015-07-29 MED ORDER — METRONIDAZOLE 500 MG PO TABS: 2000.0000 mg | ORAL_TABLET | Freq: Once | ORAL | Status: DC

## 2015-07-29 NOTE — Patient Instructions (Signed)

## 2015-07-29 NOTE — Progress Notes (Signed)
Subjective:  Lauren GottronStacey Durham is a 34 y.o. [id number] Caucasian female at 467w5d by 7wk u/s,  being seen today for her first obstetrical visit.  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  Lauren Durham at last visit she had PE, so is on problem list- but pt denies knowing of any clots. Pregnancy history fully reviewed.  Patient reports vaginal discharge and irritation, some constipation. Denies vb, cramping, uti s/s, abnormal/malodorous vag d/c, or vulvovaginal itching/irritation.  BP 108/72 mmHg  Pulse 76  Wt 166 lb (75.297 kg)  LMP 04/28/2015  HISTORY: OB History  Gravida Para Term Preterm AB SAB TAB Ectopic Multiple Living  5 3 1 2 1 1    3     # Outcome Date GA Lbr Len/2nd Weight Sex Delivery Anes PTL Lv  5 Current           4 Term 06/02/14 5733w0d  6 lb 15 oz (3.147 kg) F Vag-Spont EPI Y Y     Complications: Abruptio Placenta  3 Preterm 10/23/06 1038w0d  6 lb 9 oz (2.977 kg) M Vag-Spont None N Y  2 SAB 01/31/06          1 Preterm 10/29/99 278w0d  6 lb 7 oz (2.92 kg) M Vag-Spont EPI N Y     Past Medical History  Diagnosis Date  . MVP (mitral valve prolapse)   . Seizures (HCC) 05/2013    potassium was low, blood sugar was low  . Depression     not treated at present time  . Anxiety     not treated at present time  . Kidney infection     has had several  . Headache(784.0)     gets headache with menstrual period  . Cervical dysplasia   . Anemia     "low iron", anemic as a child  . Polycystic ovary syndrome   . Hypertension   . Bradycardia   . Pyelonephritis   .  History of UTI 06/25/2015  . Vaginal Pap smear, abnormal   . Kidney stones   . Pregnant 06/25/2015   Past Surgical History  Procedure Laterality Date  . Diagnostic laparoscopy    . Orif ankle fracture Right 09/08/2013    Procedure: OPEN REDUCTION INTERNAL FIXATION (ORIF) RIGHT ANKLE FRACTURE;  Surgeon: Cheral AlmasNaiping Michael Xu, MD;  Location: [facility] OR;  Service: Orthopedics;  Laterality: Right;   Family History  Problem Relation Age of Onset  . Mitral valve prolapse Mother   . Hypertension Mother   . Cancer Mother     ovarian cancer  . COPD Father   . Diabetes type II Father   . Pancreatitis Father   . Heart disease Father   . ADD / ADHD Son   . Asthma Son   . Hypertension Maternal Grandmother   . Mitral valve prolapse Maternal Grandmother   . Cancer Paternal Grandmother   . ADD / ADHD Son   . Heart disease Son     Exam   System:     General: Well developed & nourished, no acute  distress   Skin: Warm & dry, normal coloration and turgor, no rashes   Neurologic: Alert & oriented, normal mood   Cardiovascular: Regular rate & rhythm   Respiratory: Effort & rate normal, LCTAB, acyanotic   Abdomen: Soft, non tender   Extremities: normal strength, tone   Pelvic Exam:    Perineum: Normal perineum   Vulva: Normal, no lesions   Vagina:  Normal mucosa, normal discharge   Cervix: Normal, bulbous, appears closed   Uterus: Normal size/shape/contour for GA   Thin prep pap smear obtained w/  high risk HPV cotesting FHR: 171 via doppler   Assessment:   Pregnancy: [id number] Patient Active Problem List   Diagnosis Date Noted  . Postpartum hypertension 07/29/2015    Priority: High  . Cervical high risk HPV (human papillomavirus) test positive 12/11/2013    Priority: High  . Marijuana use 12/06/2013    Priority: High  . Supervision of normal pregnancy 12/05/2013    Priority: High  . History of preterm delivery, currently pregnant 12/05/2013    Priority: High  . Smoker 12/05/2013     Priority: High  . Mitral valve prolapse 09/08/2013    Priority: High  . Trichomonas infection 07/29/2015  . Pyelonephritis 07/22/2015  . History of UTI 06/25/2015  . History of pulmonary embolus (PE) 06/25/2015  . Ankle fracture, right 09/08/2013  . Closed right ankle fracture 09/08/2013  . Syncope 09/08/2013    [redacted]w[redacted]d [id number] New OB visit Trichomonas H/O PTB x 2 MVP PP HTN requiring meds x Constipation  Plan:  Initial labs drawn Continue prenatal vitamins Problem list reviewed and updated Reviewed n/v relief measures and warning s/s to report Reviewed recommended weight gain based on pre-gravid BMI Encouraged well-balanced diet Genetic Screening discussed Integrated Screen: requested Cystic fibrosis screening discussed declined Ultrasound discussed; fetal survey: requested Follow up in 1 weeks for 1st it/nt (no visit), then 4wks for visit and 2nd IT CCNC completed Will get records from [facility] for h/o admission for pp htn/bradycardia Offered Makena for h/o PTB, accepted, ordered today Rx flagyl 2gm po x 1 for pt and partner Lauren Durham DOB [birth year] NKDA- no etoh, and no sex until at least 7d from both taking med- will do poc at next visit Gave printed info on constipation relief  Lauren Durham CNM, [facility] 07/29/2015 5:08 PM

## 2015-07-30 LAB — CYTOLOGY - PAP

## 2015-07-31 LAB — URINE CULTURE

## 2015-08-05 ENCOUNTER — Ambulatory Visit (INDEPENDENT_AMBULATORY_CARE_PROVIDER_SITE_OTHER): Payer: Medicaid Other

## 2015-08-05 ENCOUNTER — Other Ambulatory Visit: Payer: Medicaid Other

## 2015-08-05 DIAGNOSIS — Z3A12 12 weeks gestation of pregnancy: Secondary | ICD-10-CM

## 2015-08-05 DIAGNOSIS — O09211 Supervision of pregnancy with history of pre-term labor, first trimester: Secondary | ICD-10-CM

## 2015-08-05 DIAGNOSIS — Z3682 Encounter for antenatal screening for nuchal translucency: Secondary | ICD-10-CM

## 2015-08-05 DIAGNOSIS — Z36 Encounter for antenatal screening of mother: Secondary | ICD-10-CM

## 2015-08-05 DIAGNOSIS — O98311 Other infections with a predominantly sexual mode of transmission complicating pregnancy, first trimester: Secondary | ICD-10-CM

## 2015-08-05 NOTE — Progress Notes (Signed)
US 12+5wks,measurement c/w dates,crl 64.0 mm,NB present,NT 1.8165mm,normal ov's bilat,post pl

## 2015-08-27 ENCOUNTER — Encounter: Payer: Medicaid Other | Admitting: Obstetrics & Gynecology

## 2015-08-27 ENCOUNTER — Encounter: Payer: Self-pay | Admitting: Obstetrics & Gynecology

## 2015-09-06 ENCOUNTER — Encounter: Payer: Medicaid Other | Admitting: Obstetrics & Gynecology

## 2015-09-11 ENCOUNTER — Encounter: Payer: Medicaid Other | Admitting: Advanced Practice Midwife

## 2015-09-12 ENCOUNTER — Encounter: Payer: Medicaid Other | Admitting: Advanced Practice Midwife

## 2015-09-17 ENCOUNTER — Encounter: Payer: Self-pay | Admitting: Obstetrics & Gynecology

## 2015-09-17 ENCOUNTER — Ambulatory Visit (INDEPENDENT_AMBULATORY_CARE_PROVIDER_SITE_OTHER): Payer: Medicaid Other | Admitting: Obstetrics & Gynecology

## 2015-09-17 VITALS — BP 120/70 | HR 72 | Wt 161.0 lb

## 2015-09-17 DIAGNOSIS — A5901 Trichomonal vulvovaginitis: Secondary | ICD-10-CM

## 2015-09-17 DIAGNOSIS — Z3492 Encounter for supervision of normal pregnancy, unspecified, second trimester: Secondary | ICD-10-CM

## 2015-09-17 DIAGNOSIS — Z331 Pregnant state, incidental: Secondary | ICD-10-CM

## 2015-09-17 DIAGNOSIS — A5900 Urogenital trichomoniasis, unspecified: Secondary | ICD-10-CM

## 2015-09-17 DIAGNOSIS — Z1389 Encounter for screening for other disorder: Secondary | ICD-10-CM

## 2015-09-17 LAB — POCT URINALYSIS DIPSTICK
Blood, UA: NEGATIVE
GLUCOSE UA: NEGATIVE
Ketones, UA: NEGATIVE
LEUKOCYTES UA: NEGATIVE
NITRITE UA: NEGATIVE
Protein, UA: NEGATIVE

## 2015-09-17 NOTE — Progress Notes (Signed)
[id number] 6850w6d Estimated Date of Delivery: 02/12/16  Blood pressure 120/70, pulse 72, weight 161 lb (73.029 kg), last menstrual period 04/28/2015, not currently breastfeeding.   BP weight and urine results all reviewed and noted.  Please refer to the obstetrical flow sheet for the fundal height and fetal heart rate documentation:  Patient reports good fetal movement, denies any bleeding and no rupture of membranes symptoms or regular contractions. Patient is without complaints. All questions were answered.  Orders Placed This Encounter  Procedures  . Chlamydia/Gonococcus/Trichomonas, NAA  . POCT urinalysis dipstick    Plan:  Continued routine obstetrical care, Trich NAA performed as TOC  No Follow-up on file.

## 2015-09-18 LAB — CHLAMYDIA/GONOCOCCUS/TRICHOMONAS, NAA
Chlamydia by NAA: NEGATIVE
Gonococcus by NAA: NEGATIVE
Trich vag by NAA: NEGATIVE

## 2015-09-25 LAB — AFP, QUAD SCREEN
DIA MOM VALUE: 1.22
DIA VALUE (EIA): 213.59 pg/mL
DSR (BY AGE) 1 IN: 286
DSR (Second Trimester) 1 IN: 971
Gestational Age: 18.9 WEEKS
MSAFP Mom: 0.82
MSAFP: 37.4 ng/mL
MSHCG MOM: 0.74
MSHCG: 17916 m[IU]/mL
Maternal Age At EDD: 35.2 YEARS
Osb Risk: 10000
PDF: 0
T18 (By Age): 1:1112 {titer}
Test Results:: NEGATIVE
UE3 VALUE: 1.17 ng/mL
Weight: 161 [lb_av]
uE3 Mom: 0.73

## 2015-09-27 ENCOUNTER — Other Ambulatory Visit: Payer: Self-pay | Admitting: Obstetrics & Gynecology

## 2015-09-27 DIAGNOSIS — Z1389 Encounter for screening for other disorder: Secondary | ICD-10-CM

## 2015-09-30 ENCOUNTER — Ambulatory Visit (INDEPENDENT_AMBULATORY_CARE_PROVIDER_SITE_OTHER): Payer: Medicaid Other | Admitting: Obstetrics & Gynecology

## 2015-09-30 ENCOUNTER — Ambulatory Visit (INDEPENDENT_AMBULATORY_CARE_PROVIDER_SITE_OTHER): Payer: Medicaid Other

## 2015-09-30 VITALS — BP 94/68 | HR 74 | Wt 161.0 lb

## 2015-09-30 DIAGNOSIS — Z36 Encounter for antenatal screening of mother: Secondary | ICD-10-CM | POA: Diagnosis not present

## 2015-09-30 DIAGNOSIS — Z1389 Encounter for screening for other disorder: Secondary | ICD-10-CM

## 2015-09-30 DIAGNOSIS — Z3A21 21 weeks gestation of pregnancy: Secondary | ICD-10-CM

## 2015-09-30 DIAGNOSIS — Z3492 Encounter for supervision of normal pregnancy, unspecified, second trimester: Secondary | ICD-10-CM

## 2015-09-30 DIAGNOSIS — Z3482 Encounter for supervision of other normal pregnancy, second trimester: Secondary | ICD-10-CM

## 2015-09-30 DIAGNOSIS — Z331 Pregnant state, incidental: Secondary | ICD-10-CM

## 2015-09-30 LAB — POCT URINALYSIS DIPSTICK
Glucose, UA: NEGATIVE
KETONES UA: NEGATIVE
Leukocytes, UA: NEGATIVE
Nitrite, UA: NEGATIVE
PROTEIN UA: NEGATIVE
RBC UA: NEGATIVE

## 2015-09-30 NOTE — Progress Notes (Signed)
US 20+5 wks,measurements c/w dates,cx 4.2 cm,post pl gr 0,normal ov's bilat,svp of fluid 6.8cm,LVEICF 2.36mm,fhr 149 bpm,efw 369 g,anatomy complete

## 2015-09-30 NOTE — Progress Notes (Signed)
[id number] 4848w5d Estimated Date of Delivery: 02/12/16  Blood pressure 94/68, pulse 74, weight 161 lb (73.029 kg), last menstrual period 04/28/2015, not currently breastfeeding.   BP weight and urine results all reviewed and noted.  Please refer to the obstetrical flow sheet for the fundal height and fetal heart rate documentation:  Patient reports good fetal movement, denies any bleeding and no rupture of membranes symptoms or regular contractions. Patient is without complaints. All questions were answered.  Orders Placed This Encounter  Procedures  . POCT urinalysis dipstick    Plan:  Continued routine obstetrical care, sonogram normal  Return in about 4 weeks (around 10/28/2015) for LROB.

## 2015-10-28 ENCOUNTER — Encounter: Payer: Medicaid Other | Admitting: Obstetrics & Gynecology

## 2015-11-20 ENCOUNTER — Encounter: Payer: Medicaid Other | Admitting: Obstetrics and Gynecology

## 2015-11-20 ENCOUNTER — Encounter: Payer: Self-pay | Admitting: Obstetrics and Gynecology

## 2015-12-03 ENCOUNTER — Encounter: Payer: Medicaid Other | Admitting: Obstetrics & Gynecology

## 2015-12-05 ENCOUNTER — Encounter: Payer: Self-pay | Admitting: Obstetrics & Gynecology

## 2015-12-05 ENCOUNTER — Ambulatory Visit (INDEPENDENT_AMBULATORY_CARE_PROVIDER_SITE_OTHER): Payer: Medicaid Other | Admitting: Obstetrics & Gynecology

## 2015-12-05 VITALS — BP 110/70 | HR 76 | Wt 155.0 lb

## 2015-12-05 DIAGNOSIS — Z331 Pregnant state, incidental: Secondary | ICD-10-CM

## 2015-12-05 DIAGNOSIS — Z3493 Encounter for supervision of normal pregnancy, unspecified, third trimester: Secondary | ICD-10-CM

## 2015-12-05 DIAGNOSIS — Z1389 Encounter for screening for other disorder: Secondary | ICD-10-CM

## 2015-12-05 LAB — POCT URINALYSIS DIPSTICK
Glucose, UA: NEGATIVE
KETONES UA: NEGATIVE
LEUKOCYTES UA: NEGATIVE
Nitrite, UA: NEGATIVE
PROTEIN UA: NEGATIVE
RBC UA: NEGATIVE

## 2015-12-05 MED ORDER — OMEPRAZOLE 20 MG PO CPDR: 20.0000 mg | DELAYED_RELEASE_CAPSULE | Freq: Every day | ORAL | Status: DC

## 2015-12-05 NOTE — Progress Notes (Signed)
[id number] 3830w1d Estimated Date of Delivery: 02/12/16  Blood pressure 110/70, pulse 76, weight 155 lb (70.308 kg), last menstrual period 04/28/2015, not currently breastfeeding.   BP weight and urine results all reviewed and noted.  Please refer to the obstetrical flow sheet for the fundal height and fetal heart rate documentation:  Patient reports good fetal movement, denies any bleeding and no rupture of membranes symptoms or regular contractions. Patient is without complaints. All questions were answered.  Orders Placed This Encounter  Procedures  . POCT urinalysis dipstick    Plan:  Continued routine obstetrical care, omeprazole 20 daily for GERD  No Follow-up on file.

## 2015-12-09 ENCOUNTER — Other Ambulatory Visit: Payer: Self-pay | Admitting: Adult Health

## 2015-12-26 ENCOUNTER — Encounter: Payer: Self-pay | Admitting: Obstetrics & Gynecology

## 2015-12-26 ENCOUNTER — Encounter: Payer: Medicaid Other | Admitting: Obstetrics & Gynecology

## 2016-01-13 ENCOUNTER — Encounter: Payer: Medicaid Other | Admitting: Obstetrics & Gynecology

## 2016-01-16 ENCOUNTER — Encounter: Payer: Medicaid Other | Admitting: Obstetrics & Gynecology

## 2016-01-20 ENCOUNTER — Ambulatory Visit (INDEPENDENT_AMBULATORY_CARE_PROVIDER_SITE_OTHER): Payer: Medicaid Other | Admitting: Obstetrics & Gynecology

## 2016-01-20 ENCOUNTER — Encounter: Payer: Self-pay | Admitting: Obstetrics & Gynecology

## 2016-01-20 VITALS — BP 120/80 | HR 92 | Wt 159.3 lb

## 2016-01-20 DIAGNOSIS — Z3483 Encounter for supervision of other normal pregnancy, third trimester: Secondary | ICD-10-CM

## 2016-01-20 DIAGNOSIS — Z1389 Encounter for screening for other disorder: Secondary | ICD-10-CM

## 2016-01-20 DIAGNOSIS — Z1159 Encounter for screening for other viral diseases: Secondary | ICD-10-CM

## 2016-01-20 DIAGNOSIS — Z118 Encounter for screening for other infectious and parasitic diseases: Secondary | ICD-10-CM

## 2016-01-20 DIAGNOSIS — Z3A37 37 weeks gestation of pregnancy: Secondary | ICD-10-CM

## 2016-01-20 DIAGNOSIS — Z3685 Encounter for antenatal screening for Streptococcus B: Secondary | ICD-10-CM

## 2016-01-20 DIAGNOSIS — Z3493 Encounter for supervision of normal pregnancy, unspecified, third trimester: Secondary | ICD-10-CM

## 2016-01-20 DIAGNOSIS — Z331 Pregnant state, incidental: Secondary | ICD-10-CM

## 2016-01-20 LAB — POCT URINALYSIS DIPSTICK
Blood, UA: NEGATIVE
Glucose, UA: NEGATIVE
Ketones, UA: NEGATIVE
LEUKOCYTES UA: NEGATIVE
NITRITE UA: NEGATIVE
PROTEIN UA: NEGATIVE

## 2016-01-20 NOTE — Progress Notes (Signed)
[id number] 6183w5d Estimated Date of Delivery: 02/12/16  Blood pressure 120/80, pulse 92, weight 159 lb 4.8 oz (72.258 kg), last menstrual period 04/28/2015, not currently breastfeeding.   BP weight and urine results all reviewed and noted.  Please refer to the obstetrical flow sheet for the fundal height and fetal heart rate documentation:  Patient reports good fetal movement, denies any bleeding and no rupture of membranes symptoms or regular contractions. Patient is without complaints. All questions were answered.  Orders Placed This Encounter  Procedures  . GC/Chlamydia Probe Amp  . Strep Gp B NAA+Rflx  . POCT urinalysis dipstick    Plan:  Continued routine obstetrical care, just cervical mucous no yeast or bv or trich  No Follow-up on file.

## 2016-01-22 LAB — GC/CHLAMYDIA PROBE AMP
Chlamydia trachomatis, NAA: NEGATIVE
NEISSERIA GONORRHOEAE BY PCR: NEGATIVE

## 2016-01-22 LAB — STREP GP B NAA+RFLX: Strep Gp B NAA+Rflx: NEGATIVE

## 2016-01-27 ENCOUNTER — Encounter: Payer: Medicaid Other | Admitting: Obstetrics & Gynecology

## 2016-01-27 ENCOUNTER — Encounter: Payer: Self-pay | Admitting: Obstetrics & Gynecology

## 2016-02-11 ENCOUNTER — Encounter: Payer: Medicaid Other | Admitting: Obstetrics & Gynecology

## 2016-02-24 ENCOUNTER — Encounter: Payer: Self-pay | Admitting: Obstetrics & Gynecology

## 2016-02-24 ENCOUNTER — Ambulatory Visit: Payer: Medicaid Other | Admitting: Obstetrics & Gynecology

## 2016-03-05 ENCOUNTER — Other Ambulatory Visit: Payer: Self-pay

## 2016-03-05 ENCOUNTER — Emergency Department (HOSPITAL_COMMUNITY)
Admission: EM | Admit: 2016-03-05 | Discharge: 2016-03-05 | Disposition: A | Discharge status: Home / Self Care | Payer: Medicaid Other | Attending: Emergency Medicine | Admitting: Emergency Medicine

## 2016-03-05 ENCOUNTER — Emergency Department (HOSPITAL_COMMUNITY): Payer: Medicaid Other

## 2016-03-05 ENCOUNTER — Encounter (HOSPITAL_COMMUNITY): Payer: Self-pay | Admitting: *Deleted

## 2016-03-05 DIAGNOSIS — I10 Essential (primary) hypertension: Secondary | ICD-10-CM | POA: Insufficient documentation

## 2016-03-05 DIAGNOSIS — M62838 Other muscle spasm: Secondary | ICD-10-CM | POA: Diagnosis not present

## 2016-03-05 DIAGNOSIS — R0602 Shortness of breath: Secondary | ICD-10-CM | POA: Insufficient documentation

## 2016-03-05 DIAGNOSIS — Z79899 Other long term (current) drug therapy: Secondary | ICD-10-CM | POA: Diagnosis not present

## 2016-03-05 DIAGNOSIS — R0789 Other chest pain: Secondary | ICD-10-CM | POA: Diagnosis present

## 2016-03-05 DIAGNOSIS — F329 Major depressive disorder, single episode, unspecified: Secondary | ICD-10-CM | POA: Insufficient documentation

## 2016-03-05 DIAGNOSIS — F17210 Nicotine dependence, cigarettes, uncomplicated: Secondary | ICD-10-CM | POA: Diagnosis not present

## 2016-03-05 HISTORY — DX: Other pulmonary embolism without acute cor pulmonale: I26.99

## 2016-03-05 LAB — CBC
HCT: 45.1 % (ref 36.0–46.0)
Hemoglobin: 14.9 g/dL (ref 12.0–15.0)
MCH: 29.6 pg (ref 26.0–34.0)
MCHC: 33 g/dL (ref 30.0–36.0)
MCV: 89.5 fL (ref 78.0–100.0)
PLATELETS: 184 10*3/uL (ref 150–400)
RBC: 5.04 MIL/uL (ref 3.87–5.11)
RDW: 14.1 % (ref 11.5–15.5)
WBC: 8.5 10*3/uL (ref 4.0–10.5)

## 2016-03-05 LAB — PROTIME-INR
INR: 0.96 (ref 0.00–1.49)
PROTHROMBIN TIME: 13 s (ref 11.6–15.2)

## 2016-03-05 LAB — COMPREHENSIVE METABOLIC PANEL
ALK PHOS: 82 U/L (ref 38–126)
ALT: 15 U/L (ref 14–54)
ANION GAP: 8 (ref 5–15)
AST: 15 U/L (ref 15–41)
Albumin: 4.2 g/dL (ref 3.5–5.0)
BUN: 12 mg/dL (ref 6–20)
CALCIUM: 9 mg/dL (ref 8.9–10.3)
CHLORIDE: 106 mmol/L (ref 101–111)
CO2: 24 mmol/L (ref 22–32)
Creatinine, Ser: 0.77 mg/dL (ref 0.44–1.00)
GFR calc non Af Amer: >60 mL/min (ref >60)
Glucose, Bld: 95 mg/dL (ref 65–99)
Potassium: 3.6 mmol/L (ref 3.5–5.1)
SODIUM: 138 mmol/L (ref 135–145)
Total Bilirubin: 0.4 mg/dL (ref 0.3–1.2)
Total Protein: 7.6 g/dL (ref 6.5–8.1)

## 2016-03-05 LAB — D-DIMER, QUANTITATIVE: D-Dimer, Quant: 1.3 ug/mL-FEU — ABNORMAL HIGH (ref 0.00–0.50)

## 2016-03-05 LAB — TROPONIN I

## 2016-03-05 MED ORDER — HYDROCODONE-ACETAMINOPHEN 5-325 MG PO TABS
1.0000 | ORAL_TABLET | ORAL | Status: AC
  Administered 2016-03-05: 1 via ORAL
  Filled 2016-03-05: qty 1

## 2016-03-05 MED ORDER — IOPAMIDOL (ISOVUE-370) INJECTION 76%
100.0000 mL | Freq: Once | INTRAVENOUS | Status: AC | PRN
  Administered 2016-03-05: 100 mL via INTRAVENOUS

## 2016-03-05 MED ORDER — METHOCARBAMOL 500 MG PO TABS: 500.0000 mg | ORAL_TABLET | Freq: Four times a day (QID) | ORAL | Status: DC | PRN

## 2016-03-05 NOTE — Discharge Instructions (Signed)
Heat Therapy °Heat therapy can help ease sore, stiff, injured, and tight muscles and joints. Heat relaxes your muscles, which may help ease your pain. Heat therapy should only be used on old, pre-existing, or long-lasting (chronic) injuries. Do not use heat therapy unless told by your doctor. °HOW TO USE HEAT THERAPY °There are several different kinds of heat therapy, including: °· Moist heat pack. °· Warm water bath. °· Hot water bottle. °· Electric heating pad. °· Heated gel pack. °· Heated wrap. °· Electric heating pad. °GENERAL HEAT THERAPY RECOMMENDATIONS  °· Do not sleep while using heat therapy. Only use heat therapy while you are awake. °· Your skin may turn pink while using heat therapy. Do not use heat therapy if your skin turns red. °· Do not use heat therapy if you have new pain. °· High heat or long exposure to heat can cause burns. Be careful when using heat therapy to avoid burning your skin. °· Do not use heat therapy on areas of your skin that are already irritated, such as with a rash or sunburn. °GET HELP IF:  °· You have blisters, redness, swelling (puffiness), or numbness. °· You have new pain. °· Your pain is worse. °MAKE SURE YOU: °· Understand these instructions. °· Will watch your condition. °· Will get help right away if you are not doing well or get worse. °  °This information is not intended to replace advice given to you by your health care provider. Make sure you discuss any questions you have with your health care provider. °  °Document Released: 11/23/2011 Document Revised: 09/21/2014 Document Reviewed: 10/24/2013 °Elsevier Interactive Patient Education ©2016 [employer] ° °

## 2016-03-05 NOTE — ED Notes (Signed)
Pt with sharp pain to left chest, left neck pain almost for 3 weeks.  Pt states that her son was born 3 weeks ago and last birth of last had a blood clot after delivering.

## 2016-03-05 NOTE — ED Provider Notes (Signed)
CSN: [id number]     Arrival date & time 03/05/16  2004 History  By signing my name below, I, Lauren Durham, attest that this documentation has been prepared under the direction and in the presence of Linwood DibblesJon Kamariah Fruchter, MD. Electronically signed, Lauren Durham, ED Scribe. 03/05/2016. 9:07 PM.   Chief Complaint  Patient presents with  . Chest Pain   The history is provided by the patient. No language interpreter was used.   HPI Comments: Lauren Durham is a 35 y.o. female with a PMHx of HTN, MVP, PE, who presents to the Emergency Department complaining of sudden onset, sharp chest pain that radiates to her arms onset this morning. Pt also complains of shortness of breath and neck pain. Pt is G2P2A0 and states that she gave birth 3 weeks ago at [facility]. Pt states she had a PE after delivery of her first child 21 months ago. Pt states she took lisinopril for 7 months after her PE but was not prescribed a blood thinner. Pt has taken Tylenol and Excedrin for pain with no relief. Pt denies fever and nausea.  Past Medical History  Diagnosis Date  . MVP (mitral valve prolapse)   . Seizures (HCC) 05/2013    potassium was low, blood sugar was low  . Depression     not treated at present time  . Anxiety     not treated at present time  . Kidney infection     has had several  . Headache(784.0)     gets headache with menstrual period  . Cervical dysplasia   . Anemia     "low iron", anemic as a child  . Polycystic ovary syndrome   . Hypertension   . Bradycardia   . Pyelonephritis   . History of UTI 06/25/2015  . Vaginal Pap smear, abnormal   . Kidney stones   . Pregnant 06/25/2015  . PE (pulmonary embolism)    Past Surgical History  Procedure Laterality Date  . Diagnostic laparoscopy    . Orif ankle fracture Right 09/08/2013    Procedure: OPEN REDUCTION INTERNAL FIXATION (ORIF) RIGHT ANKLE FRACTURE;  Surgeon: Cheral AlmasNaiping Michael Xu, MD;  Location: [facility] OR;  Service: Orthopedics;  Laterality: Right;   Family  History  Problem Relation Age of Onset  . Mitral valve prolapse Mother   . Hypertension Mother   . Cancer Mother     ovarian cancer  . COPD Father   . Diabetes type II Father   . Pancreatitis Father   . Heart disease Father   . ADD / ADHD Son   . Asthma Son   . Hypertension Maternal Grandmother   . Mitral valve prolapse Maternal Grandmother   . Cancer Paternal Grandmother   . ADD / ADHD Son   . Heart disease Son    Social History  Substance Use Topics  . Smoking status: Current Every Day Smoker -- 0.25 packs/day for 15 years    Types: Cigarettes  . Smokeless tobacco: Never Used  . Alcohol Use: No   OB History    Gravida Para Term Preterm AB TAB SAB Ectopic Multiple Living   5 3 1 2 1  1   3      Review of Systems  Constitutional: Negative for fever.  Respiratory: Positive for shortness of breath.   Cardiovascular: Positive for chest pain.  Gastrointestinal: Negative for nausea.  Musculoskeletal: Positive for neck pain and neck stiffness.  All other systems reviewed and are negative.     Allergies  Motrin; Tape; Keflex; Penicillins; and Sulfa antibiotics  Home Medications   Prior to Admission medications   Medication Sig Start Date End Date Taking? Authorizing Provider  acetaminophen (TYLENOL) 500 MG tablet Take 500 mg by mouth every 6 (six) hours as needed for mild pain.    Yes Historical Provider, MD  omeprazole (PRILOSEC) 20 MG capsule Take 1 capsule (20 mg total) by mouth daily. 1 tablet a day 12/05/15  Yes Lazaro Arms, MD  methocarbamol (ROBAXIN) 500 MG tablet Take 1 tablet (500 mg total) by mouth every 6 (six) hours as needed for muscle spasms. 03/05/16   Linwood Dibbles, MD   BP 128/99 mmHg  Pulse 73  Temp(Src) 98 F (36.7 C) (Oral)  Resp 15  Ht 5' 1.5" (1.562 m)  Wt 64.411 kg  BMI 26.40 kg/m2  SpO2 98%  LMP 04/28/2015 (Exact Date)  Breastfeeding? No Physical Exam  Constitutional: She appears well-developed and well-nourished. No distress.  HENT:   Head: Normocephalic and atraumatic.  Right Ear: External ear normal.  Left Ear: External ear normal.  Eyes: Conjunctivae are normal. Right eye exhibits no discharge. Left eye exhibits no discharge. No scleral icterus.  Neck: Neck supple. Muscular tenderness present. No rigidity. Decreased range of motion present. No tracheal deviation present. No thyroid mass and no thyromegaly present.  Cardiovascular: Normal rate, regular rhythm and intact distal pulses.   Pulmonary/Chest: Effort normal and breath sounds normal. No stridor. No respiratory distress. She has no wheezes. She has no rales. She exhibits tenderness.  Abdominal: Soft. Bowel sounds are normal. She exhibits no distension. There is no tenderness. There is no rebound and no guarding.  Musculoskeletal: She exhibits no edema or tenderness.  Neurological: She is alert. She has normal strength. No cranial nerve deficit (no facial droop, extraocular movements intact, no slurred speech) or sensory deficit. She exhibits normal muscle tone. She displays no seizure activity. Coordination normal.  Skin: Skin is warm and dry. No rash noted.  Psychiatric: She has a normal mood and affect.  Nursing note and vitals reviewed.   ED Course  Procedures  9:04 PM-Will order blood work. Discussed treatment plan with pt at bedside and pt agreed to plan.   Labs Review Labs Reviewed  D-DIMER, QUANTITATIVE (NOT AT [facility]) - Abnormal; Notable for the following:    D-Dimer, Quant 1.30 (*)    All other components within normal limits  CBC  COMPREHENSIVE METABOLIC PANEL  PROTIME-INR  TROPONIN I    Imaging Review Dg Chest 2 View  03/05/2016  CLINICAL DATA:  Left-sided chest pain radiating to the left arm EXAM: CHEST  2 VIEW COMPARISON:  None. FINDINGS: The heart size and mediastinal contours are within normal limits. Both lungs are clear. The visualized skeletal structures are unremarkable. IMPRESSION: No active cardiopulmonary disease. Electronically  Signed   By: Elige Ko   On: 03/05/2016 21:47   Ct Angio Chest Pe W/cm &/or Wo Cm  03/05/2016  CLINICAL DATA:  Acute onset of shortness of breath and left-sided chest pain, radiating down both arms. Left-sided neck pain. Initial encounter. EXAM: CT ANGIOGRAPHY CHEST WITH CONTRAST TECHNIQUE: Multidetector CT imaging of the chest was performed using the standard protocol during bolus administration of intravenous contrast. Multiplanar CT image reconstructions and MIPs were obtained to evaluate the vascular anatomy. CONTRAST:  100 mL of Isovue 370 IV contrast COMPARISON:  Chest radiograph performed earlier today at 9:10 p.m. FINDINGS: There is no evidence of pulmonary embolus. Mild emphysematous change is noted at the  upper lobes bilaterally. The lungs are otherwise clear. There is no evidence of significant focal consolidation, pleural effusion or pneumothorax. No masses are identified; no abnormal focal contrast enhancement is seen. The mediastinum is unremarkable in appearance. No mediastinal lymphadenopathy is seen. No pericardial effusion is identified. The great vessels are grossly unremarkable in appearance. No axillary lymphadenopathy is seen. The visualized portions of the thyroid gland are unremarkable in appearance. The visualized portions of the liver and spleen are unremarkable. The visualized portions of the pancreas, gallbladder, stomach, adrenal glands and kidneys are within normal limits. No acute osseous abnormalities are seen. Review of the MIP images confirms the above findings. IMPRESSION: 1. No evidence of pulmonary embolus. 2. Mild emphysematous change at the upper lobes bilaterally. Lungs otherwise clear. Electronically Signed   By: Roanna RaiderJeffery  Chang M.D.   On: 03/05/2016 23:00   I have personally reviewed and evaluated these images and lab results as part of my medical decision-making.   EKG Interpretation   Date/Time:  Thursday March 05 2016 20:34:15 EDT Ventricular Rate:  83 PR  Interval:    QRS Duration: 88 QT Interval:  374 QTC Calculation: 440 R Axis:   83 Text Interpretation:  Sinus rhythm Consider left ventricular hypertrophy  No old tracing to compare Confirmed by Grainger Mccarley  MD-J, Karole Oo ([id number]) on  03/05/2016 8:44:01 PM      MDM   Final diagnoses:  Chest wall pain  Cervical paraspinal muscle spasm   Patient had reproducible chest  wall pain as well as tenderness in the paraspinal muscle region of the neck.  Symptoms sound like a musculoskeletal etiology. D-dimer was performed however because patient mentioned having history of a PE.  CT angio of the chest here does not show any evidence of pulmonary embolism.  At this time there does not appear to be any evidence of an acute emergency medical condition and the patient appears stable for discharge with appropriate outpatient follow up. I will give her a prescription for muscle relaxants. I recommend over-the-counter pain medications.    I personally performed the services described in this documentation, which was scribed in my presence.  The recorded information has been reviewed and is accurate.    Linwood DibblesJon Sharmon Cheramie, MD 03/05/16 [phone]

## 2016-03-24 ENCOUNTER — Encounter: Payer: Self-pay | Admitting: Advanced Practice Midwife

## 2016-03-24 ENCOUNTER — Ambulatory Visit (INDEPENDENT_AMBULATORY_CARE_PROVIDER_SITE_OTHER): Payer: Medicaid Other | Admitting: Advanced Practice Midwife

## 2016-03-24 NOTE — Progress Notes (Signed)
  Lauren Durham is a 35 y.o. who presents for a postpartum visit. She is 6 weeks postpartum following a spontaneous vaginal delivery. She was 39.5 weeks, [facility] ([facility] 7 cms[facility] I have fully reviewed the prenatal and intrapartum course. The delivery was at 39.5 gestational weeks.  Anesthesia: none.  Postpartum course has been complicated by abscess tooth, MSK chest pain.  BP has been elevated at the ED. BP during pregnancy, low even.  She says she had a PE after her last child:  However, what she describes sounds like preeclampsia. She received one dose of Lovenox while she was hospitalized overnight, and was placed on Lisinopril only for several months postpartum. Records are not available.  Baby's course has been uneventful. Baby is feeding by bottle. Bleeding: no bleeding. Bowel function is normal. Bladder function is normal. Patient is not sexually active. Contraception method is none. Postpartum depression screening: negative.   Current outpatient prescriptions:  .  omeprazole (PRILOSEC) 20 MG capsule, Take 1 capsule (20 mg total) by mouth daily. 1 tablet a day, Disp: 30 capsule, Rfl: 6  Review of Systems   Constitutional: Negative for fever and chills Eyes: Negative for visual disturbances Respiratory: Negative for shortness of breath, dyspnea Cardiovascular: Negative for chest pain or palpitations  Gastrointestinal: Negative for vomiting, diarrhea and constipation Genitourinary: Negative for dysuria and urgency Musculoskeletal: Negative for back pain, joint pain, myalgias  Neurological: Negative for dizziness and headaches   Objective:     Filed Vitals:   03/24/16 1204  BP: 138/100  Pulse: 62   General:  alert, cooperative and no distress   Breasts:  negative  Lungs: clear to auscultation bilaterally  Heart:  regular rate and rhythm  Abdomen: Soft, nontender   Vulva:  normal  Vagina: normal vagina  Cervix:  closed  Corpus: Well involuted     Rectal Exam: no  hemorrhoids        Assessment:    normal postpartum exam.  Plan:    1. Contraception: condoms 2. Follow up in: 3 weeks for pre op BTL  (papers signed today)  3>  Keep eye on BP

## 2016-04-14 ENCOUNTER — Ambulatory Visit (INDEPENDENT_AMBULATORY_CARE_PROVIDER_SITE_OTHER): Payer: Medicaid Other | Admitting: Obstetrics & Gynecology

## 2016-04-14 ENCOUNTER — Encounter: Payer: Self-pay | Admitting: Obstetrics & Gynecology

## 2016-04-14 VITALS — BP 110/60 | HR 84 | Ht 61.0 in | Wt 138.4 lb

## 2016-04-14 DIAGNOSIS — Z3009 Encounter for other general counseling and advice on contraception: Secondary | ICD-10-CM | POA: Diagnosis not present

## 2016-04-14 DIAGNOSIS — Z01818 Encounter for other preprocedural examination: Secondary | ICD-10-CM | POA: Diagnosis not present

## 2016-04-14 NOTE — Progress Notes (Signed)
Preoperative History and Physical  Lauren Durham is a 35 y.o. [phone] with Patient's last menstrual period was 03/25/2015. admitted for a laparoscopic bilateral salpingectomy for sterilization.    PMH:    Past Medical History:  Diagnosis Date  . Anemia    "low iron", anemic as a child  . Anxiety    not treated at present time  . Bradycardia   . Cervical dysplasia   . Depression    not treated at present time  . Headache(784.0)    gets headache with menstrual period  . History of UTI 06/25/2015  . Hypertension   . Kidney infection    has had several  . Kidney stones   . MVP (mitral valve prolapse)   . PE (pulmonary embolism)   . Polycystic ovary syndrome   . Pregnant 06/25/2015  . Pyelonephritis   . Seizures (HCC) 05/2013   potassium was low, blood sugar was low  . Vaginal Pap smear, abnormal     PSH:     Past Surgical History:  Procedure Laterality Date  . DIAGNOSTIC LAPAROSCOPY    . ORIF ANKLE FRACTURE Right 09/08/2013   Procedure: OPEN REDUCTION INTERNAL FIXATION (ORIF) RIGHT ANKLE FRACTURE;  Surgeon: Cheral Almas, MD;  Location: [facility] OR;  Service: Orthopedics;  Laterality: Right;    POb/GynH:      OB History    Gravida Para Term Preterm AB Living   5 3 1 2 1 3    SAB TAB Ectopic Multiple Live Births   1       3      SH:   Social History  Substance Use Topics  . Smoking status: Current Every Day Smoker    Packs/day: 0.25    Years: 15.00    Types: Cigarettes  . Smokeless tobacco: Never Used  . Alcohol use No    FH:    Family History  Problem Relation Age of Onset  . Mitral valve prolapse Mother   . Hypertension Mother   . Cancer Mother     ovarian cancer  . COPD Father   . Diabetes type II Father   . Pancreatitis Father   . Heart disease Father   . ADD / ADHD Son   . Asthma Son   . Hypertension Maternal Grandmother   . Mitral valve prolapse Maternal Grandmother   . Cancer Paternal Grandmother   . ADD / ADHD Son   . Heart disease Son       Allergies:  Allergies  Allergen Reactions  . Motrin [Ibuprofen] Other (See Comments)    Stomach upset  . Tape Itching and Swelling  . Keflex [Cephalexin] Rash  . Penicillins Rash    Has patient had a PCN reaction causing immediate rash, facial/tongue/throat swelling, SOB or lightheadedness with hypotension: Yes Has patient had a PCN reaction causing severe rash involving mucus membranes or skin necrosis: No Has patient had a PCN reaction that required hospitalization No Has patient had a PCN reaction occurring within the last 10 years: No If all of the above answers are "NO", then may proceed with Cephalosporin use.   . Sulfa Antibiotics Rash    Medications:       Current Outpatient Prescriptions:  .  omeprazole (PRILOSEC) 20 MG capsule, Take 1 capsule (20 mg total) by mouth daily. 1 tablet a day, Disp: 30 capsule, Rfl: 6  Review of Systems:   Review of Systems  Constitutional: Negative for fever, chills, weight loss, malaise/fatigue and diaphoresis.  HENT: Negative  for hearing loss, ear pain, nosebleeds, congestion, sore throat, neck pain, tinnitus and ear discharge.   Eyes: Negative for blurred vision, double vision, photophobia, pain, discharge and redness.  Respiratory: Negative for cough, hemoptysis, sputum production, shortness of breath, wheezing and stridor.   Cardiovascular: Negative for chest pain, palpitations, orthopnea, claudication, leg swelling and PND.  Gastrointestinal: Positive for abdominal pain. Negative for heartburn, nausea, vomiting, diarrhea, constipation, blood in stool and melena.  Genitourinary: Negative for dysuria, urgency, frequency, hematuria and flank pain.  Musculoskeletal: Negative for myalgias, back pain, joint pain and falls.  Skin: Negative for itching and rash.  Neurological: Negative for dizziness, tingling, tremors, sensory change, speech change, focal weakness, seizures, loss of consciousness, weakness and headaches.   Endo/Heme/Allergies: Negative for environmental allergies and polydipsia. Does not bruise/bleed easily.  Psychiatric/Behavioral: Negative for depression, suicidal ideas, hallucinations, memory loss and substance abuse. The patient is not nervous/anxious and does not have insomnia.      PHYSICAL EXAM:  Blood pressure 110/60, pulse 84, height 5\' 1"  (1.549 m), weight 138 lb 6.4 oz (62.8 kg), last menstrual period 03/25/2015, not currently breastfeeding.    Vitals reviewed. Constitutional: She is oriented to person, place, and time. She appears well-developed and well-nourished.  HENT:  Head: Normocephalic and atraumatic.  Right Ear: External ear normal.  Left Ear: External ear normal.  Nose: Nose normal.  Mouth/Throat: Oropharynx is clear and moist.  Eyes: Conjunctivae and EOM are normal. Pupils are equal, round, and reactive to light. Right eye exhibits no discharge. Left eye exhibits no discharge. No scleral icterus.  Neck: Normal range of motion. Neck supple. No tracheal deviation present. No thyromegaly present.  Cardiovascular: Normal rate, regular rhythm, normal heart sounds and intact distal pulses.  Exam reveals no gallop and no friction rub.   No murmur heard. Respiratory: Effort normal and breath sounds normal. No respiratory distress. She has no wheezes. She has no rales. She exhibits no tenderness.  GI: Soft. Bowel sounds are normal. She exhibits no distension and no mass. There is tenderness. There is no rebound and no guarding.  Genitourinary:       Vulva is normal without lesions Vagina is pink moist without discharge Cervix normal in appearance and pap is normal Uterus is normal size, contour, position, consistency, mobility, non-tender Adnexa is negative with normal sized ovaries by sonogram  Musculoskeletal: Normal range of motion. She exhibits no edema and no tenderness.  Neurological: She is alert and oriented to person, place, and time. She has normal reflexes. She  displays normal reflexes. No cranial nerve deficit. She exhibits normal muscle tone. Coordination normal.  Skin: Skin is warm and dry. No rash noted. No erythema. No pallor.  Psychiatric: She has a normal mood and affect. Her behavior is normal. Judgment and thought content normal.    Labs: No results found for this or any previous visit (from the past 336 hour(s)).  EKG: Orders placed or performed during the hospital encounter of 03/05/16  . EKG    Imaging Studies: No results found.    Assessment: Multiparous female desires sterilization Elects salpingectomy for ovarian cancer prophylaxis Patient Active Problem List   Diagnosis Date Noted  . Postpartum hypertension 07/29/2015  . Trichomonas infection 07/29/2015  . Pyelonephritis 07/22/2015  . History of UTI 06/25/2015  . History of pulmonary embolus (PE) 06/25/2015  . Cervical high risk HPV (human papillomavirus) test positive 12/11/2013  . Marijuana use 12/06/2013  . Smoker 12/05/2013  . Ankle fracture, right 09/08/2013  . Closed  right ankle fracture 09/08/2013  . Syncope 09/08/2013  . Mitral valve prolapse 09/08/2013    Plan: laparoscopic bilateral salpingectomy for sterilization 04/29/2016  EURE,LUTHER H 04/14/2016 2:29 PM

## 2016-04-22 NOTE — Patient Instructions (Addendum)
Lauren Durham  04/22/2016     @   Your procedure is scheduled on  04/29/2016   Report to [facility] at  710  A.M.  Call this number if you have problems the morning of surgery:  [phone]   Remember:  Do not eat food or drink liquids after midnight.  Take these medicines the morning of surgery with A SIP OF WATER  prilosec.   Do not wear jewelry, make-up or nail polish.  Do not wear lotions, powders, or perfumes.  You may wear deoderant.  Do not shave 48 hours prior to surgery.  Men may shave face and neck.  Do not bring valuables to the hospital.  [facility] is not responsible for any belongings or valuables.  Contacts, dentures or bridgework may not be worn into surgery.  Leave your suitcase in the car.  After surgery it may be brought to your room.  For patients admitted to the hospital, discharge time will be determined by your treatment team.  Patients discharged the day of surgery will not be allowed to drive home.   Name and phone number of your driver:   family Special instructions:  none  Please read over the following fact sheets that you were given. Surgical Site Infection Prevention and Anesthesia Post-op Instructions       Sterilization Information, Female Female sterilization is a procedure to permanently prevent pregnancy. There are different ways to perform sterilization, but all either block or close the fallopian tubes so that your eggs cannot reach your uterus. If your egg cannot reach your uterus, sperm cannot fertilize the egg, and you cannot get pregnant.  Sterilization is performed by a surgical procedure. Sometimes these procedures are performed in a hospital while a patient is asleep. Sometimes they can be done in a clinic setting with the patient awake. The fallopian tubes can be surgically cut, tied, or sealed through a procedure called tubal ligation. The fallopian tubes can also be closed with clips  or rings. Sterilization can also be done by placing a tiny coil into each fallopian tube, which causes scar tissue to grow inside the tube. The scar tissue then blocks the tubes.  Discuss sterilization with your caregiver to answer any concerns you or your partner may have. You may want to ask what type of sterilization your caregiver performs. Some caregivers may not perform all the various options. Sterilization is permanent and should only be done if you are sure you do not want children or do not want any more children. Having a sterilization reversed may not be successful.  STERILIZATION PROCEDURES  Laparoscopic sterilization. This is a surgical method performed at a time other than right after childbirth. Two incisions are made in the lower abdomen. A thin, lighted tube (laparoscope) is inserted into one of the incisions and is used to perform the procedure. The fallopian tubes are closed with a ring or a clip. An instrument that uses heat could be used to seal the tubes closed (electrocautery).   Mini-laparotomy. This is a surgical method done 1 or 2 days after giving birth. Typically, a small incision is made just below the belly button (umbilicus) and the fallopian tubes are exposed. The tubes can then be sealed, tied, or cut.   Hysteroscopic sterilization. This is performed at a time other than right after childbirth. A tiny, spring-like coil is inserted through the cervix and uterus  and placed into the fallopian tubes. The coil causes scaring and blocks the tubes. Other forms of contraception should be used for 3 months after the procedure to allow the scar tissue to form completely. Additionally, it is required hysterosalpingography be done 3 months later to ensure that the procedure was successful. Hysterosalpingography is a procedure that uses X-rays to look at your uterus and fallopian tubes after a material to make them show up better has been inserted. IS STERILIZATION  SAFE? Sterilization is considered safe with very rare complications. Risks depend on the type of procedure you have. As with any surgical procedure, there are risks. Some risks of sterilization by any means include:   Bleeding.  Infection.  Reaction to anesthesia medicine.  Injury to surrounding organs. Risks specific to having hysteroscopic coils placed include:  The coils may not be placed correctly the first time.   The coils may move out of place.   The tubes may not get completely blocked after 3 months.   Injury to surrounding organs when placing the coil.  HOW EFFECTIVE IS FEMALE STERILIZATION? Sterilization is nearly 100% effective, but it can fail. Depending on the type of sterilization, the rate of failure can be as high as 3%. After hysteroscopic sterilization with placement of fallopian tube coils, you will need back-up birth control for 3 months after the procedure. Sterilization is effective for a lifetime.  BENEFITS OF STERILIZATION  It does not affect your hormones, and therefore will not affect your menstrual periods, sexual desire, or performance.   It is effective for a lifetime.   It is safe.   You do not need to worry about getting pregnant. Keep in mind that if you had the hysteroscopic placement procedure, you must wait 3 months after the procedure (or until your caregiver confirms) before pregnancy is not considered possible.   There are no side effects unlike other types of birth control (contraception).  DRAWBACKS OF STERILIZATION  You must be sure you do not want children or any more children. The procedure is permanent.   It does not provide protection against sexually transmitted infections (STIs).   The tubes can grow back together. If this happens, there is a risk of pregnancy. There is also an increased risk (50%) of pregnancy being an ectopic pregnancy. This is a pregnancy that happens outside of the uterus.   This information is  not intended to replace advice given to you by your health care provider. Make sure you discuss any questions you have with your health care provider.   Document Released: 02/17/2008 Document Revised: 09/05/2013 Document Reviewed: 12/17/2011 Elsevier Interactive Patient Education 2016 [employer] Salpingectomy Salpingectomy, also called tubectomy, is the surgical removal of one of the fallopian tubes. The fallopian tubes are tubes that are connected to the uterus. These tubes transport the egg from the ovary to the uterus. A salpingectomy may be done for various reasons, including:   A tubal (ectopic) pregnancy. This is especially true if the tube ruptures.  An infected fallopian tube.  The need to remove the fallopian tube when removing an ovary with a cyst or tumor.  The need to remove the fallopian tube when removing the uterus.  Cancer of the fallopian tube or nearby organs. Removing one fallopian tube does not prevent you from becoming pregnant. It also does not cause problems with your menstrual periods.  LET [facility] KNOW ABOUT:  Any allergies you have.  All medicines you are taking, including vitamins, herbs,  eye drops, creams, and over-the-counter medicines.  Previous problems you or members of your family have had with the use of anesthetics.  Any blood disorders you have.  Previous surgeries you have had.  Medical conditions you have. RISKS AND COMPLICATIONS  Generally, this is a safe procedure. However, as with any procedure, complications can occur. Possible complications include:  Injury to surrounding organs.  Bleeding.  Infection.  Problems related to anesthesia. BEFORE THE PROCEDURE  Ask your health care provider about changing or stopping your regular medicines. You may need to stop taking certain medicines, such as aspirin or blood thinners, at least 1 week before the surgery.  Do not eat or drink anything for at least 8 hours before  the surgery.  If you smoke, do not smoke for at least 2 weeks before the surgery.  Make plans to have someone drive you home after the procedure or after your hospital stay. Also arrange for someone to help you with activities during recovery. PROCEDURE   You will be given medicine to help you relax before the procedure (sedative). You will then be given medicine to make you sleep through the procedure (general anesthetic). These medicines will be given through an IV access tube that is put into one of your veins.  Once you are asleep, your lower abdomen will be shaved and cleaned. A thin, flexible tube (catheter) will be placed in your bladder.  The surgeon may use a laparoscopic, robotic, or open technique for this surgery:  In the laparoscopic technique, the surgery is done through two small cuts (incisions) in the abdomen. A thin, lighted tube with a tiny camera on the end (laparoscope) is inserted into one of the incisions. The tools needed for the procedure are put through the other incision.  A robotic technique may be chosen to perform complex surgery in a small space. In the robotic technique, small incisions will be made. A camera and surgical instruments are passed through the incisions. Surgical instruments will be controlled with the help of a robotic arm.  In the open technique, the surgery is done through one large incision in the abdomen.  Using any of these techniques, the surgeon removes the fallopian tube from where it attaches to the uterus. The blood vessels will be clamped and tied.  The surgeon then uses staples or stitches to close the incision or incisions. AFTER THE PROCEDURE   You will be taken to a recovery area where your progress will be monitored for 1-3 hours.  If the laparoscopic technique was used, you may be allowed to go home after several hours. You may have some shoulder pain after the laparoscopic procedure. This is normal and usually goes away in a  day or two.  If the open technique was used, you will be admitted to the hospital for a couple of days.  You will be given pain medicine if needed.  The IV access tube and catheter will be removed before you are discharged.   This information is not intended to replace advice given to you by your health care provider. Make sure you discuss any questions you have with your health care provider.   Document Released: 01/17/2009 Document Revised: 09/21/2014 Document Reviewed: 02/22/2013 Elsevier Interactive Patient Education 2016 [employer]. Salpingectomy, Care After Refer to this sheet in the next few weeks. These instructions provide you with information on caring for yourself after your procedure. Your health care provider may also give you more specific instructions. Your treatment has  been planned according to current medical practices, but problems sometimes occur. Call your health care provider if you have any problems or questions after your procedure. WHAT TO EXPECT AFTER THE PROCEDURE After your procedure, it is typical to have the following:  Abdominal pain that can be controlled with pain medicine.  Vaginal spotting.  Tiredness. HOME CARE INSTRUCTIONS  Get plenty of rest and sleep.  Only take over-the-counter or prescription medicines as directed by your health care provider.  Keep incision areas clean and dry. Remove or change any bandages (dressings) only as directed by your health care provider.  You may resume your regular diet. Eat a well-balanced diet.  Drink enough fluids to keep your urine clear or pale yellow.  Limit exercise and activities as directed by your health care provider. Do not lift anything heavier than 5 lb (2.3 kg) until your health care provider approves.  Do not drive until your health care provider approves.  Do not have sexual intercourse until your health care provider says it is okay.  Take your temperature twice a day for the first  week. Write those temperatures down.  Follow up with your health care provider as directed. SEEK MEDICAL CARE IF:  You have pain when you urinate.  You see pus coming out of the incision, or the incision is separating.  You have increasing abdominal pain.  You have swelling or redness in the incision area.  You develop a rash.  You feel lightheaded.  You have pain that is not controlled with medicine. SEEK IMMEDIATE MEDICAL CARE IF:  You develop a fever.  You have increasing abdominal pain.  You develop chest or leg pain.  You develop shortness of breath.  You pass out.   This information is not intended to replace advice given to you by your health care provider. Make sure you discuss any questions you have with your health care provider.   Document Released: 12/05/2010 Document Revised: 09/21/2014 Document Reviewed: 02/22/2013 Elsevier Interactive Patient Education 2016 [employer] Inc. General Anesthesia, Adult General anesthesia is a sleep-like state of non-feeling produced by medicines (anesthetics). General anesthesia prevents you from being alert and feeling pain during a medical procedure. Your caregiver may recommend general anesthesia if your procedure:  Is long.  Is painful or uncomfortable.  Would be frightening to see or hear.  Requires you to be still.  Affects your breathing.  Causes significant blood loss. LET YOUR CAREGIVER KNOW ABOUT:  Allergies to food or medicine.  Medicines taken, including vitamins, herbs, eyedrops, over-the-counter medicines, and creams.  Use of steroids (by mouth or creams).  Previous problems with anesthetics or numbing medicines, including problems experienced by relatives.  History of bleeding problems or blood clots.  Previous surgeries and types of anesthetics received.  Possibility of pregnancy, if this applies.  Use of cigarettes, alcohol, or illegal drugs.  Any health condition(s), especially diabetes,  sleep apnea, and high blood pressure. RISKS AND COMPLICATIONS General anesthesia rarely causes complications. However, if complications do occur, they can be life threatening. Complications include:  A lung infection.  A stroke.  A heart attack.  Waking up during the procedure. When this occurs, the patient may be unable to move and communicate that he or she is awake. The patient may feel severe pain. Older adults and adults with serious medical problems are more likely to have complications than adults who are young and healthy. Some complications can be prevented by answering all of your caregiver's questions thoroughly and by following  all pre-procedure instructions. It is important to tell your caregiver if any of the pre-procedure instructions, especially those related to diet, were not followed. Any food or liquid in the stomach can cause problems when you are under general anesthesia. BEFORE THE PROCEDURE  Ask your caregiver if you will have to spend the night at the hospital. If you will not have to spend the night, arrange to have an adult drive you and stay with you for 24 hours.  Follow your caregiver's instructions if you are taking dietary supplements or medicines. Your caregiver may tell you to stop taking them or to reduce your dosage.  Do not smoke for as long as possible before your procedure. If possible, stop smoking 3-6 weeks before the procedure.  Do not take new dietary supplements or medicines within 1 week of your procedure unless your caregiver approves them.  Do not eat within 8 hours of your procedure or as directed by your caregiver. Drink only clear liquids, such as water, black coffee (without milk or cream), and fruit juices (without pulp).  Do not drink within 3 hours of your procedure or as directed by your caregiver.  You may brush your teeth on the morning of the procedure, but make sure to spit out the toothpaste and water when finished. PROCEDURE  You  will receive anesthetics through a mask, through an intravenous (IV) access tube, or through both. A doctor who specializes in anesthesia (anesthesiologist) or a nurse who specializes in anesthesia (nurse anesthetist) or both will stay with you throughout the procedure to make sure you remain unconscious. He or she will also watch your blood pressure, pulse, and oxygen levels to make sure that the anesthetics do not cause any problems. Once you are asleep, a breathing tube or mask may be used to help you breathe. AFTER THE PROCEDURE You will wake up after the procedure is complete. You may be in the room where the procedure was performed or in a recovery area. You may have a sore throat if a breathing tube was used. You may also feel:  Dizzy.  Weak.  Drowsy.  Confused.  Nauseous.  Cold. These are all normal responses and can be expected to last for up to 24 hours after the procedure is complete. A caregiver will tell you when you are ready to go home. This will usually be when you are fully awake and in stable condition.   This information is not intended to replace advice given to you by your health care provider. Make sure you discuss any questions you have with your health care provider.   Document Released: 12/08/2007 Document Revised: 09/21/2014 Document Reviewed: 12/30/2011 Elsevier Interactive Patient Education 2016 Elsevier Inc. General Anesthesia, Adult, Care After Refer to this sheet in the next few weeks. These instructions provide you with information on caring for yourself after your procedure. Your health care provider may also give you more specific instructions. Your treatment has been planned according to current medical practices, but problems sometimes occur. Call your health care provider if you have any problems or questions after your procedure. WHAT TO EXPECT AFTER THE PROCEDURE After the procedure, it is typical to experience:  Sleepiness.  Nausea and  vomiting. HOME CARE INSTRUCTIONS  For the first 24 hours after general anesthesia:  Have a responsible person with you.  Do not drive a car. If you are alone, do not take public transportation.  Do not drink alcohol.  Do not take medicine that has not been  prescribed by your health care provider.  Do not sign important papers or make important decisions.  You may resume a normal diet and activities as directed by your health care provider.  Change bandages (dressings) as directed.  If you have questions or problems that seem related to general anesthesia, call the hospital and ask for the anesthetist or anesthesiologist on call. SEEK MEDICAL CARE IF:  You have nausea and vomiting that continue the day after anesthesia.  You develop a rash. SEEK IMMEDIATE MEDICAL CARE IF:   You have difficulty breathing.  You have chest pain.  You have any allergic problems.   This information is not intended to replace advice given to you by your health care provider. Make sure you discuss any questions you have with your health care provider.   Document Released: 12/07/2000 Document Revised: 09/21/2014 Document Reviewed: 12/30/2011 Elsevier Interactive Patient Education [employer]

## 2016-04-23 ENCOUNTER — Encounter (HOSPITAL_COMMUNITY): Payer: Self-pay

## 2016-04-23 ENCOUNTER — Encounter (HOSPITAL_COMMUNITY)
Admission: RE | Admit: 2016-04-23 | Discharge: 2016-04-23 | Disposition: A | Discharge status: Home / Self Care | Payer: Medicaid Other | Source: Ambulatory Visit | Attending: Obstetrics & Gynecology | Admitting: Obstetrics & Gynecology

## 2016-04-23 DIAGNOSIS — Z01810 Encounter for preprocedural cardiovascular examination: Secondary | ICD-10-CM | POA: Insufficient documentation

## 2016-04-23 DIAGNOSIS — Z01812 Encounter for preprocedural laboratory examination: Secondary | ICD-10-CM | POA: Diagnosis present

## 2016-04-23 HISTORY — DX: Nonrheumatic mitral (valve) prolapse: I34.1

## 2016-04-23 HISTORY — DX: Other specified postprocedural states: Z98.890

## 2016-04-23 HISTORY — DX: Nausea with vomiting, unspecified: R11.2

## 2016-04-23 HISTORY — DX: Gastro-esophageal reflux disease without esophagitis: K21.9

## 2016-04-23 LAB — COMPREHENSIVE METABOLIC PANEL
ALT: 13 U/L — AB (ref 14–54)
ANION GAP: 7 (ref 5–15)
AST: 16 U/L (ref 15–41)
Albumin: 4.1 g/dL (ref 3.5–5.0)
Alkaline Phosphatase: 57 U/L (ref 38–126)
BUN: 12 mg/dL (ref 6–20)
CHLORIDE: 106 mmol/L (ref 101–111)
CO2: 22 mmol/L (ref 22–32)
CREATININE: 0.76 mg/dL (ref 0.44–1.00)
Calcium: 8.5 mg/dL — ABNORMAL LOW (ref 8.9–10.3)
Glucose, Bld: 79 mg/dL (ref 65–99)
Potassium: 4.9 mmol/L (ref 3.5–5.1)
SODIUM: 135 mmol/L (ref 135–145)
Total Bilirubin: 0.4 mg/dL (ref 0.3–1.2)
Total Protein: 6.8 g/dL (ref 6.5–8.1)

## 2016-04-23 LAB — URINALYSIS, ROUTINE W REFLEX MICROSCOPIC
BILIRUBIN URINE: NEGATIVE
Glucose, UA: NEGATIVE mg/dL
HGB URINE DIPSTICK: NEGATIVE
Ketones, ur: NEGATIVE mg/dL
Leukocytes, UA: NEGATIVE
NITRITE: NEGATIVE
PROTEIN: NEGATIVE mg/dL
Specific Gravity, Urine: 1.015 (ref 1.005–1.030)
pH: 6.5 (ref 5.0–8.0)

## 2016-04-23 LAB — CBC
HCT: 41.5 % (ref 36.0–46.0)
Hemoglobin: 13.7 g/dL (ref 12.0–15.0)
MCH: 30.2 pg (ref 26.0–34.0)
MCHC: 33 g/dL (ref 30.0–36.0)
MCV: 91.6 fL (ref 78.0–100.0)
PLATELETS: 223 10*3/uL (ref 150–400)
RBC: 4.53 MIL/uL (ref 3.87–5.11)
RDW: 14.9 % (ref 11.5–15.5)
WBC: 8.7 10*3/uL (ref 4.0–10.5)

## 2016-04-23 LAB — HCG, QUANTITATIVE, PREGNANCY

## 2016-04-23 NOTE — Pre-Procedure Instructions (Signed)
Patient given information to sign up for my chart at home. 

## 2016-04-29 ENCOUNTER — Encounter (HOSPITAL_COMMUNITY): Payer: Self-pay | Admitting: *Deleted

## 2016-04-29 ENCOUNTER — Ambulatory Visit (HOSPITAL_COMMUNITY): Payer: Medicaid Other | Admitting: Anesthesiology

## 2016-04-29 ENCOUNTER — Ambulatory Visit (HOSPITAL_COMMUNITY)
Admission: RE | Admit: 2016-04-29 | Discharge: 2016-04-29 | Disposition: A | Discharge status: Home / Self Care | Payer: Medicaid Other | Source: Ambulatory Visit | Attending: Obstetrics & Gynecology | Admitting: Obstetrics & Gynecology

## 2016-04-29 ENCOUNTER — Encounter (HOSPITAL_COMMUNITY)
Admission: RE | Disposition: A | Discharge status: Home / Self Care | Payer: Self-pay | Source: Ambulatory Visit | Attending: Obstetrics & Gynecology

## 2016-04-29 DIAGNOSIS — Z8249 Family history of ischemic heart disease and other diseases of the circulatory system: Secondary | ICD-10-CM | POA: Diagnosis not present

## 2016-04-29 DIAGNOSIS — F17210 Nicotine dependence, cigarettes, uncomplicated: Secondary | ICD-10-CM | POA: Insufficient documentation

## 2016-04-29 DIAGNOSIS — R51 Headache: Secondary | ICD-10-CM | POA: Diagnosis not present

## 2016-04-29 DIAGNOSIS — R001 Bradycardia, unspecified: Secondary | ICD-10-CM | POA: Insufficient documentation

## 2016-04-29 DIAGNOSIS — N8311 Corpus luteum cyst of right ovary: Secondary | ICD-10-CM | POA: Diagnosis not present

## 2016-04-29 DIAGNOSIS — Z8379 Family history of other diseases of the digestive system: Secondary | ICD-10-CM | POA: Insufficient documentation

## 2016-04-29 DIAGNOSIS — F1290 Cannabis use, unspecified, uncomplicated: Secondary | ICD-10-CM | POA: Insufficient documentation

## 2016-04-29 DIAGNOSIS — Z86711 Personal history of pulmonary embolism: Secondary | ICD-10-CM | POA: Diagnosis not present

## 2016-04-29 DIAGNOSIS — F419 Anxiety disorder, unspecified: Secondary | ICD-10-CM | POA: Insufficient documentation

## 2016-04-29 DIAGNOSIS — Z302 Encounter for sterilization: Secondary | ICD-10-CM | POA: Diagnosis not present

## 2016-04-29 DIAGNOSIS — Z882 Allergy status to sulfonamides status: Secondary | ICD-10-CM | POA: Diagnosis not present

## 2016-04-29 DIAGNOSIS — E282 Polycystic ovarian syndrome: Secondary | ICD-10-CM | POA: Diagnosis not present

## 2016-04-29 DIAGNOSIS — Z833 Family history of diabetes mellitus: Secondary | ICD-10-CM | POA: Insufficient documentation

## 2016-04-29 DIAGNOSIS — Z87440 Personal history of urinary (tract) infections: Secondary | ICD-10-CM | POA: Insufficient documentation

## 2016-04-29 DIAGNOSIS — N838 Other noninflammatory disorders of ovary, fallopian tube and broad ligament: Secondary | ICD-10-CM | POA: Diagnosis present

## 2016-04-29 DIAGNOSIS — I341 Nonrheumatic mitral (valve) prolapse: Secondary | ICD-10-CM | POA: Insufficient documentation

## 2016-04-29 DIAGNOSIS — F329 Major depressive disorder, single episode, unspecified: Secondary | ICD-10-CM | POA: Diagnosis not present

## 2016-04-29 DIAGNOSIS — A599 Trichomoniasis, unspecified: Secondary | ICD-10-CM | POA: Diagnosis not present

## 2016-04-29 DIAGNOSIS — Z91048 Other nonmedicinal substance allergy status: Secondary | ICD-10-CM | POA: Insufficient documentation

## 2016-04-29 DIAGNOSIS — Z87442 Personal history of urinary calculi: Secondary | ICD-10-CM | POA: Diagnosis not present

## 2016-04-29 DIAGNOSIS — I10 Essential (primary) hypertension: Secondary | ICD-10-CM | POA: Insufficient documentation

## 2016-04-29 DIAGNOSIS — Z818 Family history of other mental and behavioral disorders: Secondary | ICD-10-CM | POA: Insufficient documentation

## 2016-04-29 DIAGNOSIS — Z886 Allergy status to analgesic agent status: Secondary | ICD-10-CM | POA: Insufficient documentation

## 2016-04-29 DIAGNOSIS — Z825 Family history of asthma and other chronic lower respiratory diseases: Secondary | ICD-10-CM | POA: Insufficient documentation

## 2016-04-29 DIAGNOSIS — Z881 Allergy status to other antibiotic agents status: Secondary | ICD-10-CM | POA: Diagnosis not present

## 2016-04-29 DIAGNOSIS — Z8041 Family history of malignant neoplasm of ovary: Secondary | ICD-10-CM | POA: Diagnosis not present

## 2016-04-29 HISTORY — PX: LAPAROSCOPIC BILATERAL SALPINGECTOMY: SHX5889

## 2016-04-29 SURGERY — SALPINGECTOMY, BILATERAL, LAPAROSCOPIC
Anesthesia: General | Site: Abdomen | Laterality: Bilateral

## 2016-04-29 MED ORDER — GLYCOPYRROLATE 0.2 MG/ML IJ SOLN
INTRAMUSCULAR | Status: AC
  Filled 2016-04-29: qty 3

## 2016-04-29 MED ORDER — EPHEDRINE SULFATE 50 MG/ML IJ SOLN
INTRAMUSCULAR | Status: DC | PRN
  Administered 2016-04-29: 10 mg via INTRAVENOUS

## 2016-04-29 MED ORDER — GLYCOPYRROLATE 0.2 MG/ML IJ SOLN
INTRAMUSCULAR | Status: DC | PRN
  Administered 2016-04-29: .6 mg via INTRAVENOUS

## 2016-04-29 MED ORDER — HYDROCODONE-ACETAMINOPHEN 5-325 MG PO TABS: 1.0000 | ORAL_TABLET | Freq: Four times a day (QID) | ORAL | 0 refills | Status: DC | PRN

## 2016-04-29 MED ORDER — KETOROLAC TROMETHAMINE 30 MG/ML IJ SOLN
30.0000 mg | Freq: Once | INTRAMUSCULAR | Status: AC
  Administered 2016-04-29: 30 mg via INTRAVENOUS
  Filled 2016-04-29: qty 1

## 2016-04-29 MED ORDER — BUPIVACAINE LIPOSOME 1.3 % IJ SUSP
INTRAMUSCULAR | Status: AC
  Filled 2016-04-29: qty 20

## 2016-04-29 MED ORDER — LIDOCAINE HCL (CARDIAC) 10 MG/ML IV SOLN
INTRAVENOUS | Status: DC | PRN
  Administered 2016-04-29: 40 mg via INTRAVENOUS

## 2016-04-29 MED ORDER — SODIUM CHLORIDE 0.9 % IJ SOLN
INTRAMUSCULAR | Status: AC
  Filled 2016-04-29: qty 10

## 2016-04-29 MED ORDER — ROCURONIUM 10MG/ML (10ML) SYRINGE FOR MEDFUSION PUMP - OPTIME
INTRAVENOUS | Status: DC | PRN
  Administered 2016-04-29: 30 mg via INTRAVENOUS

## 2016-04-29 MED ORDER — GENTAMICIN SULFATE 40 MG/ML IJ SOLN
INTRAMUSCULAR | Status: AC
  Administered 2016-04-29: 330 mL via INTRAVENOUS
  Filled 2016-04-29: qty 8.25

## 2016-04-29 MED ORDER — PROPOFOL 10 MG/ML IV BOLUS
INTRAVENOUS | Status: DC | PRN
  Administered 2016-04-29: 150 mg via INTRAVENOUS

## 2016-04-29 MED ORDER — SCOPOLAMINE 1 MG/3DAYS TD PT72
1.0000 | MEDICATED_PATCH | Freq: Once | TRANSDERMAL | Status: DC
  Administered 2016-04-29: 1.5 mg via TRANSDERMAL
  Filled 2016-04-29: qty 1

## 2016-04-29 MED ORDER — NEOSTIGMINE METHYLSULFATE 10 MG/10ML IV SOLN
INTRAVENOUS | Status: DC | PRN
  Administered 2016-04-29: 3 mg via INTRAVENOUS

## 2016-04-29 MED ORDER — SODIUM CHLORIDE 0.9 % IR SOLN
Status: DC | PRN
  Administered 2016-04-29: 1000 mL

## 2016-04-29 MED ORDER — BUPIVACAINE LIPOSOME 1.3 % IJ SUSP
INTRAMUSCULAR | Status: DC | PRN
  Administered 2016-04-29: 20 mL

## 2016-04-29 MED ORDER — MIDAZOLAM HCL 2 MG/2ML IJ SOLN
1.0000 mg | INTRAMUSCULAR | Status: DC | PRN
  Administered 2016-04-29 (×2): 1 mg via INTRAVENOUS

## 2016-04-29 MED ORDER — LACTATED RINGERS IV SOLN
INTRAVENOUS | Status: DC
  Administered 2016-04-29 (×2): via INTRAVENOUS

## 2016-04-29 MED ORDER — PROPOFOL 10 MG/ML IV BOLUS
INTRAVENOUS | Status: AC
Start: 2016-04-29 — End: 2016-04-29
  Filled 2016-04-29: qty 20

## 2016-04-29 MED ORDER — EPHEDRINE SULFATE 50 MG/ML IJ SOLN
INTRAMUSCULAR | Status: AC
  Filled 2016-04-29: qty 1

## 2016-04-29 MED ORDER — SUCCINYLCHOLINE CHLORIDE 20 MG/ML IJ SOLN
INTRAMUSCULAR | Status: AC
  Filled 2016-04-29: qty 1

## 2016-04-29 MED ORDER — HYDROMORPHONE HCL 1 MG/ML IJ SOLN
0.2500 mg | INTRAMUSCULAR | Status: DC | PRN
  Administered 2016-04-29: 0.5 mg via INTRAVENOUS
  Filled 2016-04-29: qty 1

## 2016-04-29 MED ORDER — DEXAMETHASONE SODIUM PHOSPHATE 4 MG/ML IJ SOLN
4.0000 mg | Freq: Once | INTRAMUSCULAR | Status: AC
  Administered 2016-04-29: 4 mg via INTRAVENOUS
  Filled 2016-04-29: qty 1

## 2016-04-29 MED ORDER — ROCURONIUM BROMIDE 50 MG/5ML IV SOLN
INTRAVENOUS | Status: AC
  Filled 2016-04-29: qty 2

## 2016-04-29 MED ORDER — MIDAZOLAM HCL 2 MG/2ML IJ SOLN
INTRAMUSCULAR | Status: AC
  Filled 2016-04-29: qty 2

## 2016-04-29 MED ORDER — ONDANSETRON HCL 8 MG PO TABS: 8.0000 mg | ORAL_TABLET | Freq: Three times a day (TID) | ORAL | 0 refills | Status: DC | PRN

## 2016-04-29 MED ORDER — LIDOCAINE HCL (PF) 1 % IJ SOLN
INTRAMUSCULAR | Status: AC
  Filled 2016-04-29: qty 10

## 2016-04-29 MED ORDER — FENTANYL CITRATE (PF) 100 MCG/2ML IJ SOLN
INTRAMUSCULAR | Status: DC | PRN
  Administered 2016-04-29 (×5): 50 ug via INTRAVENOUS

## 2016-04-29 MED ORDER — ONDANSETRON HCL 4 MG/2ML IJ SOLN
4.0000 mg | Freq: Once | INTRAMUSCULAR | Status: AC
  Administered 2016-04-29: 4 mg via INTRAVENOUS
  Filled 2016-04-29: qty 2

## 2016-04-29 MED ORDER — BUPIVACAINE LIPOSOME 1.3 % IJ SUSP
20.0000 mL | Freq: Once | INTRAMUSCULAR | Status: DC
  Filled 2016-04-29: qty 20

## 2016-04-29 MED ORDER — FENTANYL CITRATE (PF) 250 MCG/5ML IJ SOLN
INTRAMUSCULAR | Status: AC
  Filled 2016-04-29: qty 5

## 2016-04-29 MED ORDER — FENTANYL CITRATE (PF) 100 MCG/2ML IJ SOLN
INTRAMUSCULAR | Status: AC
  Filled 2016-04-29: qty 2

## 2016-04-29 SURGICAL SUPPLY — 45 items
APPLIER CLIP 5 13 M/L LIGAMAX5 (MISCELLANEOUS)
APR CLP MED LRG 5 ANG JAW (MISCELLANEOUS)
BAG HAMPER (MISCELLANEOUS) ×3 IMPLANT
BLADE SURG SZ11 CARB STEEL (BLADE) ×3 IMPLANT
CLIP APPLIE 5 13 M/L LIGAMAX5 (MISCELLANEOUS) IMPLANT
CLOTH BEACON ORANGE TIMEOUT ST (SAFETY) ×3 IMPLANT
COVER LIGHT HANDLE STERIS (MISCELLANEOUS) ×6 IMPLANT
DRAPE PROXIMA HALF (DRAPES) ×3 IMPLANT
ELECT REM PT RETURN 9FT ADLT (ELECTROSURGICAL) ×3
ELECTRODE REM PT RTRN 9FT ADLT (ELECTROSURGICAL) ×1 IMPLANT
FILTER SMOKE EVAC LAPAROSHD (FILTER) IMPLANT
FORMALIN 10 PREFIL 120ML (MISCELLANEOUS) ×9 IMPLANT
GLOVE BIOGEL PI IND STRL 7.0 (GLOVE) ×1 IMPLANT
GLOVE BIOGEL PI IND STRL 8 (GLOVE) ×1 IMPLANT
GLOVE BIOGEL PI INDICATOR 7.0 (GLOVE) ×2
GLOVE BIOGEL PI INDICATOR 8 (GLOVE) ×2
GLOVE ECLIPSE 8.0 STRL XLNG CF (GLOVE) ×3 IMPLANT
GOWN STRL REUS W/TWL LRG LVL3 (GOWN DISPOSABLE) ×3 IMPLANT
GOWN STRL REUS W/TWL XL LVL3 (GOWN DISPOSABLE) ×3 IMPLANT
INST SET LAPROSCOPIC GYN AP (KITS) ×3 IMPLANT
IV NS IRRIG 3000ML ARTHROMATIC (IV SOLUTION) IMPLANT
KIT ROOM TURNOVER AP CYSTO (KITS) ×3 IMPLANT
MANIFOLD NEPTUNE II (INSTRUMENTS) ×3 IMPLANT
NDL HYPO 21X1.5 SAFETY (NEEDLE) ×1 IMPLANT
NDL INSUFFLATION 14GA 120MM (NEEDLE) ×1 IMPLANT
NEEDLE HYPO 21X1.5 SAFETY (NEEDLE) ×3 IMPLANT
NEEDLE INSUFFLATION 14GA 120MM (NEEDLE) ×3 IMPLANT
PACK PERI GYN (CUSTOM PROCEDURE TRAY) ×3 IMPLANT
PAD ARMBOARD 7.5X6 YLW CONV (MISCELLANEOUS) ×3 IMPLANT
SET BASIN LINEN APH (SET/KITS/TRAYS/PACK) ×3 IMPLANT
SET TUBE IRRIG SUCTION NO TIP (IRRIGATION / IRRIGATOR) IMPLANT
SHEARS HARMONIC ACE PLUS 36CM (ENDOMECHANICALS) ×3 IMPLANT
SLEEVE ENDOPATH XCEL 5M (ENDOMECHANICALS) ×3 IMPLANT
SOLUTION ANTI FOG 6CC (MISCELLANEOUS) ×3 IMPLANT
SPONGE GAUZE 2X2 8PLY STER LF (GAUZE/BANDAGES/DRESSINGS) ×3
SPONGE GAUZE 2X2 8PLY STRL LF (GAUZE/BANDAGES/DRESSINGS) ×6 IMPLANT
STAPLER VISISTAT 35W (STAPLE) ×3 IMPLANT
SUT VICRYL 0 UR6 27IN ABS (SUTURE) ×3 IMPLANT
SYR 20CC LL (SYRINGE) ×3 IMPLANT
SYRINGE 10CC LL (SYRINGE) ×3 IMPLANT
TAPE CLOTH SURG 4X10 WHT LF (GAUZE/BANDAGES/DRESSINGS) ×2 IMPLANT
TROCAR ENDO BLADELESS 11MM (ENDOMECHANICALS) ×3 IMPLANT
TROCAR XCEL NON-BLD 5MMX100MML (ENDOMECHANICALS) ×3 IMPLANT
TUBING INSUF HEATED (TUBING) ×3 IMPLANT
WARMER LAPAROSCOPE (MISCELLANEOUS) ×3 IMPLANT

## 2016-04-29 NOTE — Anesthesia Preprocedure Evaluation (Signed)
Anesthesia Evaluation  Patient identified by MRN, date of birth, ID band Patient awake    Reviewed: Allergy & Precautions, H&P , NPO status , Patient's Chart, lab work & pertinent test results  History of Anesthesia Complications (+) PONV and history of anesthetic complications  Airway Mallampati: II  TM Distance: >3 FB Neck ROM: Full    Dental  (+) Teeth Intact, Dental Advisory Given   Pulmonary Current Smoker, PE   breath sounds clear to auscultation       Cardiovascular hypertension (no meds now),  Rhythm:Regular Rate:Normal     Neuro/Psych  Headaches, Seizures -,  PSYCHIATRIC DISORDERS Anxiety Depression    GI/Hepatic GERD  Medicated and Controlled,(+)     substance abuse  marijuana use,   Endo/Other    Renal/GU      Musculoskeletal   Abdominal   Peds  Hematology   Anesthesia Other Findings   Reproductive/Obstetrics                             Anesthesia Physical Anesthesia Plan  ASA: III  Anesthesia Plan: General   Post-op Pain Management:    Induction: Intravenous  Airway Management Planned: Oral ETT  Additional Equipment:   Intra-op Plan:   Post-operative Plan: Extubation in OR  Informed Consent: I have reviewed the patients History and Physical, chart, labs and discussed the procedure including the risks, benefits and alternatives for the proposed anesthesia with the patient or authorized representative who has indicated his/her understanding and acceptance.     Plan Discussed with:   Anesthesia Plan Comments:         Anesthesia Quick Evaluation

## 2016-04-29 NOTE — H&P (Signed)
Preoperative History and Physical  Lauren GottronStacey Durham is a 35 y.o. [phone] with Patient's last menstrual period was 03/25/2015. admitted for a laparoscopic bilateral salpingectomy for sterilization.    PMH:        Past Medical History:  Diagnosis Date  . Anemia    "low iron", anemic as a child  . Anxiety    not treated at present time  . Bradycardia   . Cervical dysplasia   . Depression    not treated at present time  . Headache(784.0)    gets headache with menstrual period  . History of UTI 06/25/2015  . Hypertension   . Kidney infection    has had several  . Kidney stones   . MVP (mitral valve prolapse)   . PE (pulmonary embolism)   . Polycystic ovary syndrome   . Pregnant 06/25/2015  . Pyelonephritis   . Seizures (HCC) 05/2013   potassium was low, blood sugar was low  . Vaginal Pap smear, abnormal     PSH:          Past Surgical History:  Procedure Laterality Date  . DIAGNOSTIC LAPAROSCOPY    . ORIF ANKLE FRACTURE Right 09/08/2013   Procedure: OPEN REDUCTION INTERNAL FIXATION (ORIF) RIGHT ANKLE FRACTURE;  Surgeon: Cheral AlmasNaiping Michael Xu, MD;  Location: [facility] OR;  Service: Orthopedics;  Laterality: Right;    POb/GynH:              OB History    Gravida Para Term Preterm AB Living   5 3 1 2 1 3    SAB TAB Ectopic Multiple Live Births   1       3      SH:        Social History  Substance Use Topics  . Smoking status: Current Every Day Smoker    Packs/day: 0.25    Years: 15.00    Types: Cigarettes  . Smokeless tobacco: Never Used  . Alcohol use No    FH:          Family History  Problem Relation Age of Onset  . Mitral valve prolapse Mother   . Hypertension Mother   . Cancer Mother     ovarian cancer  . COPD Father   . Diabetes type II Father   . Pancreatitis Father   . Heart disease Father   . ADD / ADHD Son   . Asthma Son   . Hypertension Maternal Grandmother   . Mitral valve prolapse Maternal  Grandmother   . Cancer Paternal Grandmother   . ADD / ADHD Son   . Heart disease Son      Allergies:       Allergies  Allergen Reactions  . Motrin [Ibuprofen] Other (See Comments)    Stomach upset  . Tape Itching and Swelling  . Keflex [Cephalexin] Rash  . Penicillins Rash    Has patient had a PCN reaction causing immediate rash, facial/tongue/throat swelling, SOB or lightheadedness with hypotension: Yes Has patient had a PCN reaction causing severe rash involving mucus membranes or skin necrosis: No Has patient had a PCN reaction that required hospitalization No Has patient had a PCN reaction occurring within the last 10 years: No If all of the above answers are "NO", then may proceed with Cephalosporin use.  . Sulfa Antibiotics Rash    Medications:       Current Outpatient Prescriptions:  .  omeprazole (PRILOSEC) 20 MG capsule, Take 1 capsule (20 mg total) by mouth daily.  1 tablet a day, Disp: 30 capsule, Rfl: 6  Review of Systems:   Review of Systems  Constitutional: Negative for fever, chills, weight loss, malaise/fatigue and diaphoresis.  HENT: Negative for hearing loss, ear pain, nosebleeds, congestion, sore throat, neck pain, tinnitus and ear discharge.   Eyes: Negative for blurred vision, double vision, photophobia, pain, discharge and redness.  Respiratory: Negative for cough, hemoptysis, sputum production, shortness of breath, wheezing and stridor.   Cardiovascular: Negative for chest pain, palpitations, orthopnea, claudication, leg swelling and PND.  Gastrointestinal: Positive for abdominal pain. Negative for heartburn, nausea, vomiting, diarrhea, constipation, blood in stool and melena.  Genitourinary: Negative for dysuria, urgency, frequency, hematuria and flank pain.  Musculoskeletal: Negative for myalgias, back pain, joint pain and falls.  Skin: Negative for itching and rash.  Neurological: Negative for dizziness, tingling, tremors, sensory  change, speech change, focal weakness, seizures, loss of consciousness, weakness and headaches.  Endo/Heme/Allergies: Negative for environmental allergies and polydipsia. Does not bruise/bleed easily.  Psychiatric/Behavioral: Negative for depression, suicidal ideas, hallucinations, memory loss and substance abuse. The patient is not nervous/anxious and does not have insomnia.      PHYSICAL EXAM:  Blood pressure 110/60, pulse 84, height 5\' 1"  (1.549 m), weight 138 lb 6.4 oz (62.8 kg), last menstrual period 03/25/2015, not currently breastfeeding.    Vitals reviewed. Constitutional: She is oriented to person, place, and time. She appears well-developed and well-nourished.  HENT:  Head: Normocephalic and atraumatic.  Right Ear: External ear normal.  Left Ear: External ear normal.  Nose: Nose normal.  Mouth/Throat: Oropharynx is clear and moist.  Eyes: Conjunctivae and EOM are normal. Pupils are equal, round, and reactive to light. Right eye exhibits no discharge. Left eye exhibits no discharge. No scleral icterus.  Neck: Normal range of motion. Neck supple. No tracheal deviation present. No thyromegaly present.  Cardiovascular: Normal rate, regular rhythm, normal heart sounds and intact distal pulses.  Exam reveals no gallop and no friction rub.   No murmur heard. Respiratory: Effort normal and breath sounds normal. No respiratory distress. She has no wheezes. She has no rales. She exhibits no tenderness.  GI: Soft. Bowel sounds are normal. She exhibits no distension and no mass. There is tenderness. There is no rebound and no guarding.  Genitourinary:       Vulva is normal without lesions Vagina is pink moist without discharge Cervix normal in appearance and pap is normal Uterus is normal size, contour, position, consistency, mobility, non-tender Adnexa is negative with normal sized ovaries by sonogram  Musculoskeletal: Normal range of motion. She exhibits no edema and no  tenderness.  Neurological: She is alert and oriented to person, place, and time. She has normal reflexes. She displays normal reflexes. No cranial nerve deficit. She exhibits normal muscle tone. Coordination normal.  Skin: Skin is warm and dry. No rash noted. No erythema. No pallor.  Psychiatric: She has a normal mood and affect. Her behavior is normal. Judgment and thought content normal.    Labs: Results for orders placed or performed during the hospital encounter of 04/23/16 (from the past 336 hour(s))  CBC   Collection Time: 04/23/16  1:20 PM  Result Value Ref Range   WBC 8.7 4.0 - 10.5 K/uL   RBC 4.53 3.87 - 5.11 MIL/uL   Hemoglobin 13.7 12.0 - 15.0 g/dL   HCT 16.1 09.6 - 04.5 %   MCV 91.6 78.0 - 100.0 fL   MCH 30.2 26.0 - 34.0 pg   MCHC 33.0 30.0 -  36.0 g/dL   RDW 16.114.9 09.611.5 - 04.515.5 %   Platelets 223 150 - 400 K/uL  Comprehensive metabolic panel   Collection Time: 04/23/16  1:20 PM  Result Value Ref Range   Sodium 135 135 - 145 mmol/L   Potassium 4.9 3.5 - 5.1 mmol/L   Chloride 106 101 - 111 mmol/L   CO2 22 22 - 32 mmol/L   Glucose, Bld 79 65 - 99 mg/dL   BUN 12 6 - 20 mg/dL   Creatinine, Ser 4.090.76 0.44 - 1.00 mg/dL   Calcium 8.5 (L) 8.9 - 10.3 mg/dL   Total Protein 6.8 6.5 - 8.1 g/dL   Albumin 4.1 3.5 - 5.0 g/dL   AST 16 15 - 41 U/L   ALT 13 (L) 14 - 54 U/L   Alkaline Phosphatase 57 38 - 126 U/L   Total Bilirubin 0.4 0.3 - 1.2 mg/dL   GFR calc non Af Amer >60 >60 mL/min   GFR calc Af Amer >60 >60 mL/min   Anion gap 7 5 - 15  hCG, quantitative, pregnancy   Collection Time: 04/23/16  1:20 PM  Result Value Ref Range   hCG, Beta Chain, Quant, S <1 <5 mIU/mL  Urinalysis, Routine w reflex microscopic (not at [facility])   Collection Time: 04/23/16  1:20 PM  Result Value Ref Range   Color, Urine YELLOW YELLOW   APPearance CLEAR CLEAR   Specific Gravity, Urine 1.015 1.005 - 1.030   pH 6.5 5.0 - 8.0   Glucose, UA NEGATIVE NEGATIVE mg/dL   Hgb urine dipstick NEGATIVE  NEGATIVE   Bilirubin Urine NEGATIVE NEGATIVE   Ketones, ur NEGATIVE NEGATIVE mg/dL   Protein, ur NEGATIVE NEGATIVE mg/dL   Nitrite NEGATIVE NEGATIVE   Leukocytes, UA NEGATIVE NEGATIVE  ] EKG:    Orders placed or performed during the hospital encounter of 03/05/16  . EKG    Imaging Studies: ImagingResults  No results found.      Assessment: Multiparous female desires sterilization Elects salpingectomy for ovarian cancer prophylaxis     Patient Active Problem List   Diagnosis Date Noted  . Postpartum hypertension 07/29/2015  . Trichomonas infection 07/29/2015  . Pyelonephritis 07/22/2015  . History of UTI 06/25/2015  . History of pulmonary embolus (PE) 06/25/2015  . Cervical high risk HPV (human papillomavirus) test positive 12/11/2013  . Marijuana use 12/06/2013  . Smoker 12/05/2013  . Ankle fracture, right 09/08/2013  . Closed right ankle fracture 09/08/2013  . Syncope 09/08/2013  . Mitral valve prolapse 09/08/2013    Plan: laparoscopic bilateral salpingectomy for sterilization 04/29/2016  Samik Balkcom H 04/14/2016 2:29 PM

## 2016-04-29 NOTE — Interval H&P Note (Signed)
History and Physical Interval Note:  04/29/2016 10:20 AM  [name]  has presented today for surgery, with the diagnosis of sterilization  The various methods of treatment have been discussed with the patient and family. After consideration of risks, benefits and other options for treatment, the patient has consented to  Procedure(s): LAPAROSCOPIC BILATERAL SALPINGECTOMY (Bilateral) as a surgical intervention .  The patient's history has been reviewed, patient examined, no change in status, stable for surgery.  I have reviewed the patient's chart and labs.  Questions were answered to the patient's satisfaction.     Candiace West H

## 2016-04-29 NOTE — Transfer of Care (Signed)
Immediate Anesthesia Transfer of Care Note  Patient: Lauren Durham  Procedure(s) Performed: Procedure(s): LAPAROSCOPIC BILATERAL SALPINGECTOMY (Bilateral)  Patient Location: PACU  Anesthesia Type:General  Level of Consciousness: awake, alert  and oriented  Airway & Oxygen Therapy: Patient Spontanous Breathing and Patient connected to face mask oxygen  Post-op Assessment: Report given to RN  Post vital signs: Reviewed and stable  Last Vitals:  Vitals:   04/29/16 0955 04/29/16 1000  BP: (!) 89/56   Resp: (!) 23 (!) 33  Temp:      Last Pain:  Vitals:   04/29/16 0712  TempSrc: Oral  PainSc: 2       Patients Stated Pain Goal: 5 (04/29/16 16100712)  Complications: No apparent anesthesia complications

## 2016-04-29 NOTE — Discharge Instructions (Signed)
Call for follow up appointment in one week  Laparoscopic Tubal Ligation, Care After Refer to this sheet in the next few weeks. These instructions provide you with information about caring for yourself after your procedure. Your health care provider may also give you more specific instructions. Your treatment has been planned according to current medical practices, but problems sometimes occur. Call your health care provider if you have any problems or questions after your procedure. WHAT TO EXPECT AFTER THE PROCEDURE After your procedure, it is common to have:  Sore throat.  Soreness at the incision site.  Mild cramping.  Tiredness.  Mild nausea or vomiting.  Shoulder pain. HOME CARE INSTRUCTIONS  Rest for the remainder of the day.  Take medicines only as directed by your health care provider. These include over-the-counter medicines and prescription medicines. Do not take aspirin, which can cause bleeding.  Over the next few days, gradually return to your normal activities and your normal diet.  Avoid sexual intercourse for 2 weeks or as directed by your health care provider.  Do not use tampons, and do not douche.  Do not drive or operate heavy machinery while taking pain medicine.  Do not lift anything that is heavier than 5 lb (2.3 kg) for 2 weeks or as directed by your health care provider.  Do not take baths. Take showers only. Ask your health care provider when you can start taking baths.  Take your temperature twice each day and write it down.  Try to have help for your household needs for the first 7-10 days.  There are many different ways to close and cover an incision, including stitches (sutures), skin glue, and adhesive strips. Follow instructions from your health care provider about:  Incision care.  Bandage (dressing) changes and removal.  Incision closure removal.  Check your incision area every day for signs of infection. Watch for:  Redness,  swelling, or pain.  Fluid, blood, or pus.  Keep all follow-up visits as directed by your health care provider. SEEK MEDICAL CARE IF:  You have redness, swelling, or increasing pain in your incision area.  You have fluid, blood, or pus coming from your incision for longer than 1 day.  You notice a bad smell coming from your incision or your dressing.  The edges of your incision break open after the sutures have been removed.  Your pain does not decrease after 2-3 days.  You have a rash.  You repeatedly become dizzy or light-headed.  You have a reaction to your medicine.  Your pain medicine is not helping.  You are constipated. SEEK IMMEDIATE MEDICAL CARE IF:  You have a fever.  You faint.  You have increasing pain in your abdomen.  You have severe pain in one or both of your shoulders.  You have bleeding or drainage from your suture sites or your vagina after surgery.  You have shortness of breath or have difficulty breathing.  You have chest pain or leg pain.  You have ongoing nausea, vomiting, or diarrhea.   This information is not intended to replace advice given to you by your health care provider. Make sure you discuss any questions you have with your health care provider.   Document Released: 03/20/2005 Document Revised: 01/15/2015 Document Reviewed: 12/12/2011 Elsevier Interactive Patient Education 2016 [employer]   PATIENT INSTRUCTIONS POST-ANESTHESIA  IMMEDIATELY FOLLOWING SURGERY:  Do not drive or operate machinery for the first twenty four hours after surgery.  Do not make any important decisions for  twenty four hours after surgery or while taking narcotic pain medications or sedatives.  If you develop intractable nausea and vomiting or a severe headache please notify your doctor immediately.  FOLLOW-UP:  Please make an appointment with your surgeon as instructed. You do not need to follow up with anesthesia unless specifically instructed to do  so.  WOUND CARE INSTRUCTIONS (if applicable):  Keep a dry clean dressing on the anesthesia/puncture wound site if there is drainage.  Once the wound has quit draining you may leave it open to air.  Generally you should leave the bandage intact for twenty four hours unless there is drainage.  If the epidural site drains for more than 36-48 hours please call the anesthesia department.  QUESTIONS?:  Please feel free to call your physician or the hospital operator if you have any questions, and they will be happy to assist you.

## 2016-04-29 NOTE — Op Note (Signed)
Preoperative Diagnosis:  1.  Multiparous female desires permanent sterilization                                          2.  Elects to have bilateral salpingectomy for ovarian cancer                                                     prophylaxis  Postoperative Diagnosis:  Same as above + hemorrhagic right corpus luteum cyst  Procedure:  Laparoscopic Bilateral Salpingectomy + removal of right corpus luteum cyst  Surgeon:  Rockne CoonsLuther H Markeise Mathews  Jr MD  Anaesthesia: general  Findings:  Patient had normal pelvic anatomy and no intraperitoneal abnormalities.  Description of Operation:  Patient was taken to the OR and placed into supine position where she underwent general anaesthesia.  She was placed in the dorsal lithotomy position and prepped and draped in the usual sterile fashion.  An incision was made in the umbilicus and dissection taken down to the rectus fascia which was incised and opened.  The non bladed trocar was then placed and the peritoneal cavity was insufflated.  The above noted findings were observed.  Additional trocars were placed in the right and left lower quadrants under direct visualization without difficulty.  The Harmonic scalpel was employed and salpingectomy of both the right and left tubes was performed.   The tubes were removed from the peritoneal cavity and sent to pathology.  There was good hemostasis bilaterally.  There was a prominent cyst of the right ovary consistent with a corpus luteum cyst.  It was removed with the Harmonic scalpel in the usual fashion.  The fascia, peritoneum and subcutaneous tissue were closed using 0 vicryl.  The skin was closed using staples.  Exparel 266 mg 20 cc was injected in the 3 incision trocar sites. The patient was awakened from anaesthesia and taken to the PACU with all counts being correct x 3.  The patient received  Gentamicin and cleocin andToradol 30 mg IV preoperatively.  All specimens were sent to pathology EBL 20 cc  Lauren Durham  H 04/29/2016 11:28 AM

## 2016-04-29 NOTE — Anesthesia Postprocedure Evaluation (Signed)
Anesthesia Post Note  Patient: Lauren GottronStacey Gubbels  Procedure(s) Performed: Procedure(s) (LRB): LAPAROSCOPIC BILATERAL SALPINGECTOMY (Bilateral)  Patient location during evaluation: PACU Anesthesia Type: General Level of consciousness: awake and alert and oriented Pain management: pain level controlled Vital Signs Assessment: post-procedure vital signs reviewed and stable Respiratory status: spontaneous breathing Cardiovascular status: blood pressure returned to baseline Postop Assessment: no signs of nausea or vomiting Anesthetic complications: no    Last Vitals:  Vitals:   04/29/16 0955 04/29/16 1000  BP: (!) 89/56   Resp: (!) 23 (!) 33  Temp:      Last Pain:  Vitals:   04/29/16 0712  TempSrc: Oral  PainSc: 2                  Hadja Harral

## 2016-04-29 NOTE — Anesthesia Procedure Notes (Signed)
Procedure Name: Intubation Date/Time: 04/29/2016 10:43 AM Performed by: Glynn OctaveANIEL, Lorayne Getchell E Pre-anesthesia Checklist: Patient identified, Patient being monitored, Timeout performed, Emergency Drugs available and Suction available Patient Re-evaluated:Patient Re-evaluated prior to inductionOxygen Delivery Method: Circle system utilized Preoxygenation: Pre-oxygenation with 100% oxygen Intubation Type: IV induction Ventilation: Mask ventilation without difficulty Laryngoscope Size: Mac and 3 Grade View: Grade I Tube type: Oral Tube size: 7.0 mm Number of attempts: 1 Airway Equipment and Method: Stylet Placement Confirmation: ETT inserted through vocal cords under direct vision,  positive ETCO2 and breath sounds checked- equal and bilateral Secured at: 21 cm Tube secured with: Tape Dental Injury: Teeth and Oropharynx as per pre-operative assessment

## 2016-05-04 ENCOUNTER — Encounter (HOSPITAL_COMMUNITY): Payer: Self-pay | Admitting: Obstetrics & Gynecology

## 2016-05-11 ENCOUNTER — Ambulatory Visit (INDEPENDENT_AMBULATORY_CARE_PROVIDER_SITE_OTHER): Payer: Medicaid Other | Admitting: Obstetrics & Gynecology

## 2016-05-11 ENCOUNTER — Encounter: Payer: Self-pay | Admitting: Obstetrics & Gynecology

## 2016-05-11 VITALS — BP 120/70 | HR 76 | Wt 142.4 lb

## 2016-05-11 DIAGNOSIS — Z9889 Other specified postprocedural states: Secondary | ICD-10-CM

## 2016-05-11 NOTE — Progress Notes (Signed)
  HPI: Patient returns for routine postoperative follow-up having undergone laparoscopic bilateral salpingectomy on 04/29/2016.  The patient's immediate postoperative recovery has been unremarkable. Since hospital discharge the patient reports no problems.   Current Outpatient Prescriptions: Multiple Vitamin (MULTIVITAMIN) capsule, Take 1 capsule by mouth daily., Disp: , Rfl:  omeprazole (PRILOSEC) 20 MG capsule, Take 1 capsule (20 mg total) by mouth daily. 1 tablet a day, Disp: 30 capsule, Rfl: 6  No current facility-administered medications for this visit.     Blood pressure 120/70, pulse 76, weight 142 lb 6.4 oz (64.6 kg), last menstrual period 03/24/2016, not currently breastfeeding.  Physical Exam: inciion clean dry intact  Diagnostic Tests: none  Pathology: benign  Impression: S/p bilateral salpingectomy for sterilization  Plan: yearly  Follow up: 6  months  Lazaro ArmsEURE,LUTHER H, MD

## 2016-06-18 ENCOUNTER — Telehealth: Payer: Self-pay | Admitting: *Deleted

## 2016-06-24 ENCOUNTER — Other Ambulatory Visit: Payer: Self-pay | Admitting: Obstetrics & Gynecology

## 2016-06-24 MED ORDER — NITROFURANTOIN MONOHYD MACRO 100 MG PO CAPS: 100.0000 mg | ORAL_CAPSULE | Freq: Two times a day (BID) | ORAL | 0 refills | Status: DC

## 2016-06-24 NOTE — Telephone Encounter (Signed)
Pt states has a UTI, requesting Rx for Macrobid, states she took Macrobid during her pregnancy due to Pyelonephritis. Offered pt an appt, pt states she is unable to come to the office for an appt due to classes.  Pt states will Dr. Despina HiddenEure Rx Macrobid.

## 2016-07-25 IMAGING — CT CT ANGIO CHEST
2 of 6 series · 19 of 46 positions shown · IV contrast (ISOVUE)
Comparison: Chest radiograph performed earlier today at [DATE] p.m.

CLINICAL DATA: Acute onset of shortness of breath and left-sided
chest pain, radiating down both arms. Left-sided neck pain. Initial
encounter.

EXAM:
CT ANGIOGRAPHY CHEST WITH CONTRAST
TECHNIQUE: Multidetector CT imaging of the chest was performed using the
standard protocol during bolus administration of intravenous
contrast. Multiplanar CT image reconstructions and MIPs were
obtained to evaluate the vascular anatomy.
CONTRAST:  100 mL of Isovue 370 IV contrast

[Series 5: pe thins 1.0 · axial · 0.71mm/px · z∈[-292,-28]mm · 16 of 294 slices shown]
[im 15/294  lung]
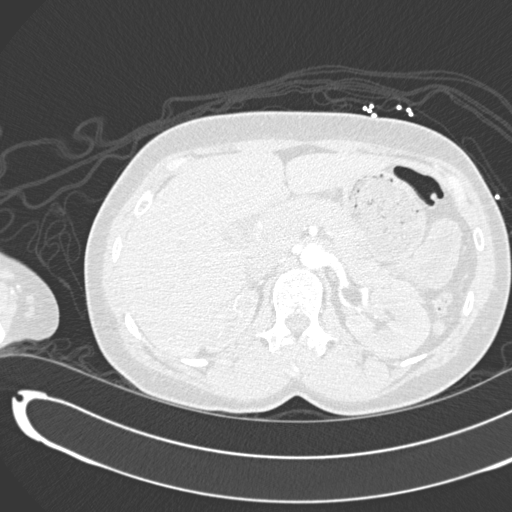
[im 30/294  soft-tissue]
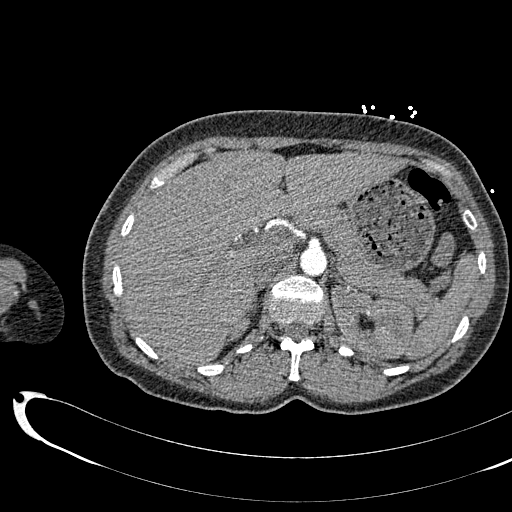
[im 44/294  lung]
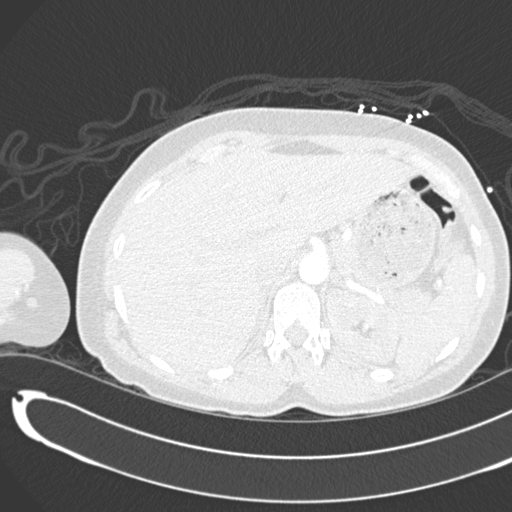
[im 74/294  soft-tissue]
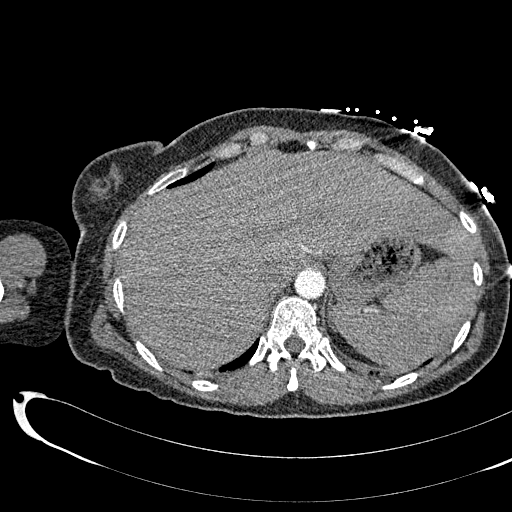
[im 88/294  lung]
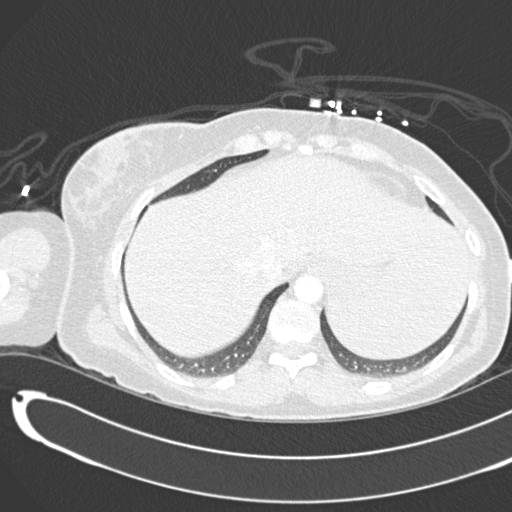
[im 103/294  soft-tissue]
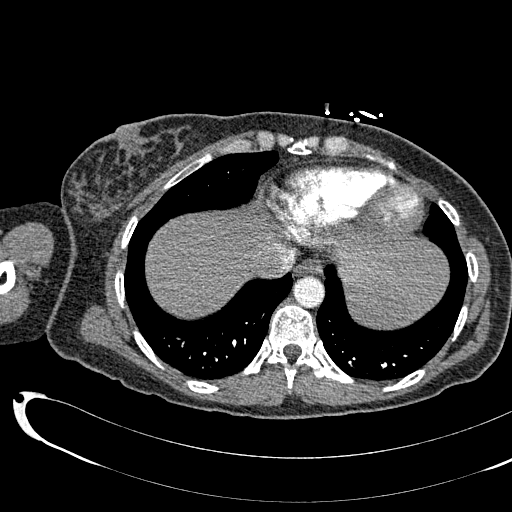
[im 118/294  lung]
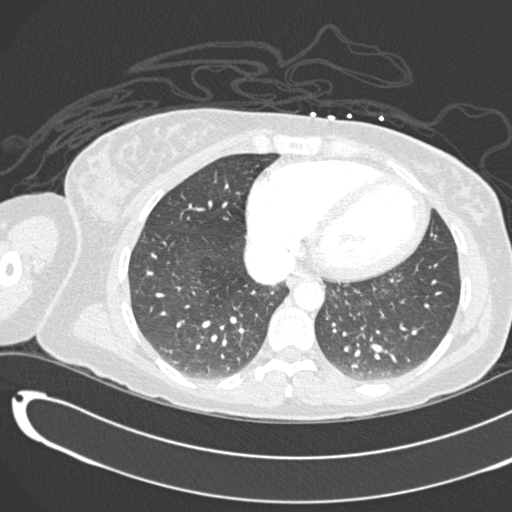
[im 132/294  soft-tissue]
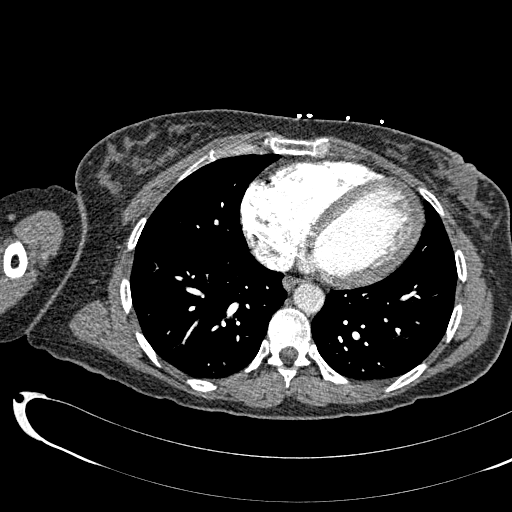
[im 162/294  lung]
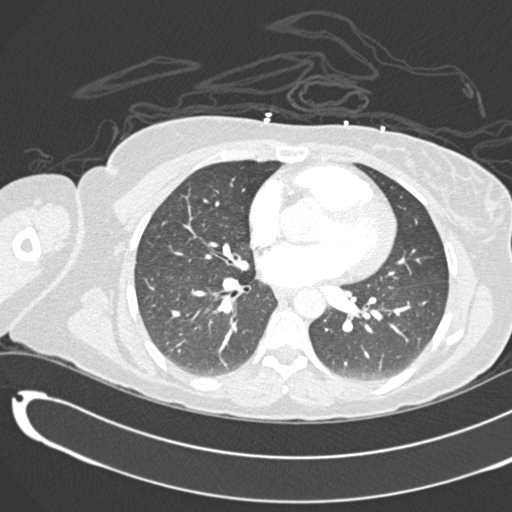
[im 176/294  soft-tissue]
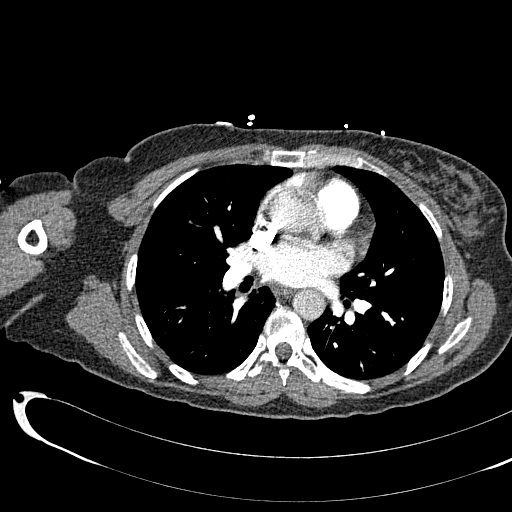
[im 191/294  lung]
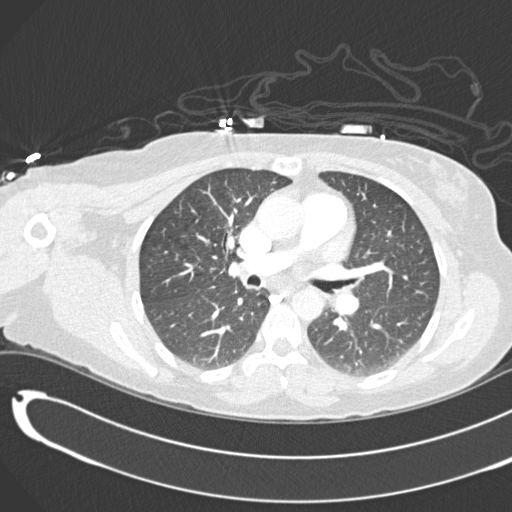
[im 206/294  soft-tissue]
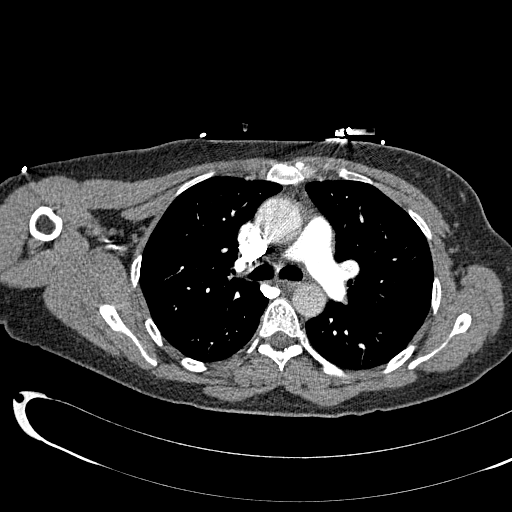
[im 220/294  lung]
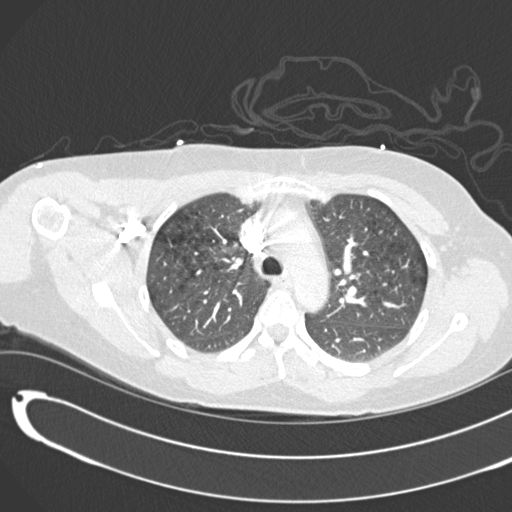
[im 250/294  soft-tissue]
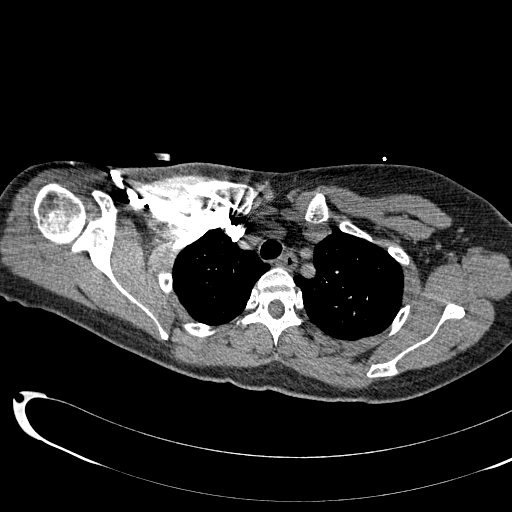
[im 264/294  lung]
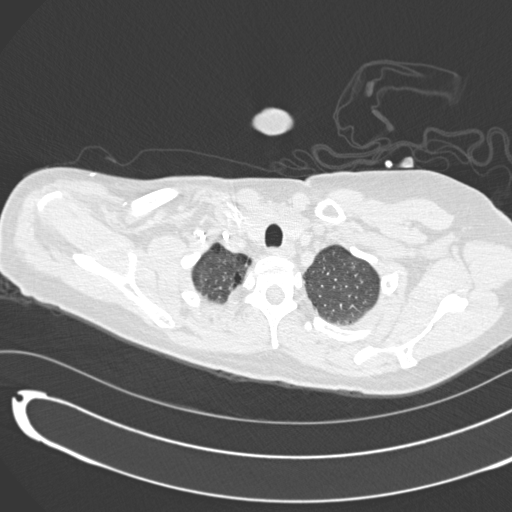
[im 279/294  soft-tissue]
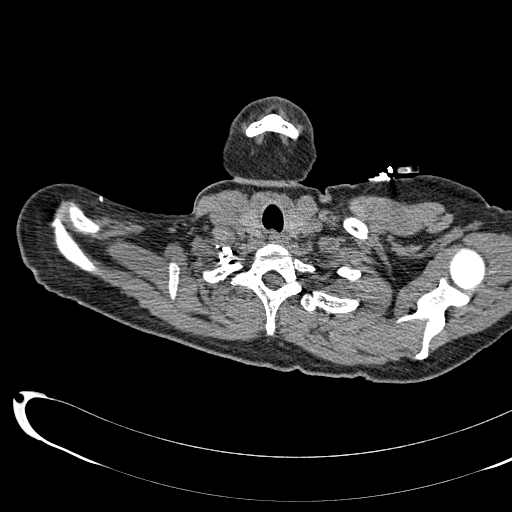

[Series 7: cor mpr 2.0 · coronal · 0.60mm/px · 3 of 111 slices shown]
[im 28/111  soft-tissue]
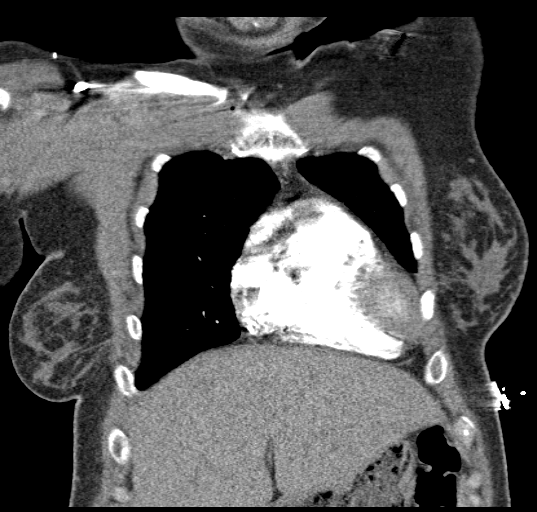
[im 56/111  soft-tissue]
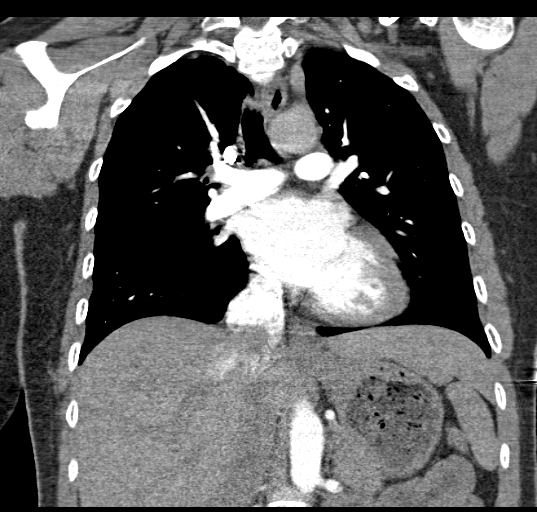
[im 83/111  soft-tissue]
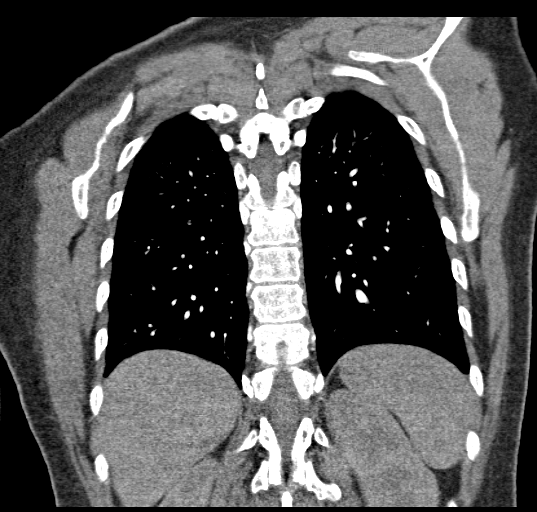

[19 of 46 positions shown; findings below may reference images not displayed]

FINDINGS: There is no evidence of pulmonary embolus.

Mild emphysematous change is noted at the upper lobes bilaterally.
The lungs are otherwise clear. There is no evidence of significant
focal consolidation, pleural effusion or pneumothorax. No masses are
identified; no abnormal focal contrast enhancement is seen.

The mediastinum is unremarkable in appearance. No mediastinal
lymphadenopathy is seen. No pericardial effusion is identified. The
great vessels are grossly unremarkable in appearance. No axillary
lymphadenopathy is seen. The visualized portions of the thyroid
gland are unremarkable in appearance.

The visualized portions of the liver and spleen are unremarkable.
The visualized portions of the pancreas, gallbladder, stomach,
adrenal glands and kidneys are within normal limits.

No acute osseous abnormalities are seen.

Review of the MIP images confirms the above findings.
IMPRESSION: 1. No evidence of pulmonary embolus.
2. Mild emphysematous change at the upper lobes bilaterally. Lungs
otherwise clear.

## 2018-06-27 ENCOUNTER — Ambulatory Visit: Payer: Self-pay | Admitting: Obstetrics & Gynecology

## 2018-06-30 ENCOUNTER — Ambulatory Visit: Payer: Self-pay | Admitting: Obstetrics & Gynecology

## 2018-07-28 ENCOUNTER — Ambulatory Visit: Payer: Self-pay | Admitting: Obstetrics & Gynecology

## 2020-06-22 ENCOUNTER — Encounter (HOSPITAL_COMMUNITY): Payer: Self-pay | Admitting: Emergency Medicine

## 2020-06-22 ENCOUNTER — Other Ambulatory Visit: Payer: Self-pay

## 2020-06-22 DIAGNOSIS — F17210 Nicotine dependence, cigarettes, uncomplicated: Secondary | ICD-10-CM | POA: Insufficient documentation

## 2020-06-22 DIAGNOSIS — Z79899 Other long term (current) drug therapy: Secondary | ICD-10-CM | POA: Insufficient documentation

## 2020-06-22 DIAGNOSIS — R630 Anorexia: Secondary | ICD-10-CM | POA: Insufficient documentation

## 2020-06-22 DIAGNOSIS — I10 Essential (primary) hypertension: Secondary | ICD-10-CM | POA: Insufficient documentation

## 2020-06-22 DIAGNOSIS — R102 Pelvic and perineal pain: Secondary | ICD-10-CM | POA: Diagnosis not present

## 2020-06-22 NOTE — ED Triage Notes (Signed)
Pt reports she has 2 "masses" in her pelvic area that she noticed 5 days ago, reports she called her OBGYN and they told her to come to the ER, pt reports she has had increased periods, decreased appetite and energy

## 2020-06-23 ENCOUNTER — Emergency Department (HOSPITAL_COMMUNITY)
Admission: EM | Admit: 2020-06-23 | Discharge: 2020-06-23 | Disposition: A | Discharge status: Home / Self Care | Payer: Medicaid Other | Attending: Emergency Medicine | Admitting: Emergency Medicine

## 2020-06-23 ENCOUNTER — Telehealth (HOSPITAL_COMMUNITY): Payer: Self-pay | Admitting: Emergency Medicine

## 2020-06-23 DIAGNOSIS — R102 Pelvic and perineal pain: Secondary | ICD-10-CM

## 2020-06-23 LAB — URINALYSIS, ROUTINE W REFLEX MICROSCOPIC
Bilirubin Urine: NEGATIVE
Glucose, UA: NEGATIVE mg/dL
Hgb urine dipstick: NEGATIVE
Ketones, ur: NEGATIVE mg/dL
Leukocytes,Ua: NEGATIVE
Nitrite: NEGATIVE
Protein, ur: NEGATIVE mg/dL
Specific Gravity, Urine: 1.006 (ref 1.005–1.030)
pH: 6 (ref 5.0–8.0)

## 2020-06-23 LAB — WET PREP, GENITAL
Clue Cells Wet Prep HPF POC: NONE SEEN
Sperm: NONE SEEN
Trich, Wet Prep: NONE SEEN
Yeast Wet Prep HPF POC: NONE SEEN

## 2020-06-23 LAB — PREGNANCY, URINE: Preg Test, Ur: NEGATIVE

## 2020-06-23 MED ORDER — HYDROCODONE-ACETAMINOPHEN 5-325 MG PO TABS
1.0000 | ORAL_TABLET | Freq: Once | ORAL | Status: AC
  Administered 2020-06-23: 1 via ORAL
  Filled 2020-06-23: qty 1

## 2020-06-23 MED ORDER — HYDROCODONE-ACETAMINOPHEN 5-325 MG PO TABS: 1.0000 | ORAL_TABLET | Freq: Four times a day (QID) | ORAL | 0 refills | Status: DC | PRN

## 2020-06-23 NOTE — ED Provider Notes (Signed)
[facility] EMERGENCY DEPARTMENT Provider Note   CSN: [id number] Arrival date & time: 06/22/20  2113     History Chief Complaint  Patient presents with  . Pelvic Pain    Lauren Durham is a 39 y.o. female.  The history is provided by the patient.  Pelvic Pain This is a new problem. The current episode started more than 2 days ago. The problem occurs daily. The problem has been gradually worsening. Pertinent negatives include no abdominal pain. The symptoms are aggravated by intercourse. Nothing relieves the symptoms. She has tried nothing for the symptoms.   Patient with history of anemia, anxiety, cervical dysplasia, depression presents with pelvic pain.  Patient reports over the past several days she has had "knots "in her pelvic region.  She reports she is having painful intercourse She also reports having 2 periods recently, but bleeding has stopped.  She also reports mild brownish discharge.  No dysuria.  No fevers or vomiting. She is in a monogamous relationship with her husband.   Patient is concerned she may have cervical cancer Patient reports she put a message into her OB/GYN, and she was called back and told to go to ER within 24 hours Past Medical History:  Diagnosis Date  . Anemia    "low iron", anemic as a child  . Anxiety    not treated at present time  . Bradycardia   . Cervical dysplasia   . Depression    not treated at present time  . GERD (gastroesophageal reflux disease)   . Headache(784.0)    gets headache with menstrual period  . History of UTI 06/25/2015  . Hypertension   . Kidney infection    has had several  . Kidney stones   . Mitral valve prolapse   . MVP (mitral valve prolapse)   . PE (pulmonary embolism)   . Polycystic ovary syndrome   . PONV (postoperative nausea and vomiting)   . Pregnant 06/25/2015  . Pyelonephritis   . Seizures (HCC) 05/2013   potassium was low, blood sugar was low;no further meds or seizure  . Vaginal Pap smear,  abnormal     Patient Active Problem List   Diagnosis Date Noted  . Postpartum hypertension 07/29/2015  . Trichomonas infection 07/29/2015  . Pyelonephritis 07/22/2015  . History of UTI 06/25/2015  . History of pulmonary embolus (PE) 06/25/2015  . Cervical high risk HPV (human papillomavirus) test positive 12/11/2013  . Marijuana use 12/06/2013  . Smoker 12/05/2013  . Ankle fracture, right 09/08/2013  . Closed right ankle fracture 09/08/2013  . Syncope 09/08/2013  . Mitral valve prolapse 09/08/2013    Past Surgical History:  Procedure Laterality Date  . DIAGNOSTIC LAPAROSCOPY    . LAPAROSCOPIC BILATERAL SALPINGECTOMY Bilateral 04/29/2016   Procedure: LAPAROSCOPIC BILATERAL SALPINGECTOMY excision of hemorrhagic corpus ludeum cyst;  Surgeon: Lazaro Arms, MD;  Location: [facility];  Service: Gynecology;  Laterality: Bilateral;  . ORIF ANKLE FRACTURE Right 09/08/2013   Procedure: OPEN REDUCTION INTERNAL FIXATION (ORIF) RIGHT ANKLE FRACTURE;  Surgeon: Cheral Almas, MD;  Location: [facility] OR;  Service: Orthopedics;  Laterality: Right;     OB History    Gravida  5   Para  3   Term  1   Preterm  2   AB  1   Living  3     SAB  1   TAB      Ectopic      Multiple      Live Births  3           Family History  Problem Relation Age of Onset  . Mitral valve prolapse Mother   . Hypertension Mother   . Cancer Mother        ovarian cancer  . COPD Father   . Diabetes type II Father   . Pancreatitis Father   . Heart disease Father   . ADD / ADHD Son   . Asthma Son   . Hypertension Maternal Grandmother   . Mitral valve prolapse Maternal Grandmother   . Cancer Paternal Grandmother   . ADD / ADHD Son   . Heart disease Son     Social History   Tobacco Use  . Smoking status: Current Every Day Smoker    Packs/day: 0.25    Years: 15.00    Pack years: 3.75    Types: Cigarettes  . Smokeless tobacco: Never Used  Vaping Use  . Vaping Use: Never used  Substance Use  Topics  . Alcohol use: No  . Drug use: Yes    Types: Marijuana    Comment: former use of marijuana ( a year ago)    Home Medications Prior to Admission medications   Medication Sig Start Date End Date Taking? Authorizing Provider  Multiple Vitamin (MULTIVITAMIN) capsule Take 1 capsule by mouth daily.    [provider]  nitrofurantoin, macrocrystal-monohydrate, (MACROBID) 100 MG capsule Take 1 capsule (100 mg total) by mouth 2 (two) times daily. 06/24/16   Lazaro Arms, MD  omeprazole (PRILOSEC) 20 MG capsule Take 1 capsule (20 mg total) by mouth daily. 1 tablet a day 12/05/15   Lazaro Arms, MD    Allergies    Motrin [ibuprofen], Tape, Keflex [cephalexin], Penicillins, and Sulfa antibiotics  Review of Systems   Review of Systems  Constitutional: Positive for chills and fatigue. Negative for fever.  Gastrointestinal: Negative for abdominal pain.  Genitourinary: Positive for dyspareunia, pelvic pain, vaginal bleeding, vaginal discharge and vaginal pain. Negative for dysuria.  All other systems reviewed and are negative.   Physical Exam Updated Vital Signs BP 96/66 (BP Location: Left Arm)   Pulse 60   Temp 98.4 F (36.9 C) (Oral)   Resp 17   Ht 1.549 m (5\' 1" )   Wt 61.2 kg   LMP 06/11/2020   SpO2 100%   BMI 25.51 kg/m   Physical Exam CONSTITUTIONAL: Well developed/well nourished, anxious HEAD: Normocephalic/atraumatic EYES: EOMI/PERRL ENMT: Mucous membranes moist NECK: supple no meningeal signs SPINE/BACK:entire spine nontender CV: S1/S2 noted, no murmurs/rubs/gallops noted LUNGS: Lungs are clear to auscultation bilaterally, no apparent distress ABDOMEN: soft, nontender, no rebound or guarding, bowel sounds noted throughout abdomen GU:no cva tenderness Pelvic exam performed with female chaperone. No vaginal bleeding or discharge. No Bartholin abscess noted. No evidence of herpes. No signs of cellulitis. Cervix does appear irregular. NEURO: Pt is  awake/alert/appropriate, moves all extremitiesx4.  No facial droop.   EXTREMITIES: pulses normal/equal, full ROM SKIN: warm, color normal PSYCH: Anxious  ED Results / Procedures / Treatments   Labs (all labs ordered are listed, but only abnormal results are displayed) Labs Reviewed  WET PREP, GENITAL - Abnormal; Notable for the following components:      Result Value   WBC, Wet Prep HPF POC MANY (*)    All other components within normal limits  URINALYSIS, ROUTINE W REFLEX MICROSCOPIC - Abnormal; Notable for the following components:   Color, Urine STRAW (*)    All other components within normal  limits  PREGNANCY, URINE  GC/CHLAMYDIA PROBE AMP ([place]) NOT AT [facility]    EKG None  Radiology No results found.  Procedures Procedures    Medications Ordered in ED Medications  HYDROcodone-acetaminophen (NORCO/VICODIN) 5-325 MG per tablet 1 tablet (has no administration in time range)    ED Course  I have reviewed the triage vital signs and the nursing notes.  Pertinent labs  results that were available during my care of the patient were reviewed by me and considered in my medical decision making (see chart for details).    MDM Rules/Calculators/A&P                          Patient reports her GYN told her to come the ER for pelvic pain. She thought she might have cervical cancer.  no signs of any abscess or infectious etiology. Advised her we would be unable to do any testing for cervical cancer. She will call her GYN tomorrow Final Clinical Impression(s) / ED Diagnoses Final diagnoses:  Pelvic pain in female    Rx / DC Orders ED Discharge Orders    None       Zadie Rhine, MD 06/23/20 [phone]

## 2020-06-23 NOTE — Telephone Encounter (Signed)
Patient contacted Dr. Ursula Alert office and their nurse recommended patient come back for prescription for pain medication because they cannot see her in the office till Tuesday or Wednesday.  10 tablets of hydrocodone provided.  Would recommend 1 tablet every 6 hours.  Total of 10 tablets provided

## 2020-06-23 NOTE — ED Notes (Signed)
Pt unhappy upon discharge, states that the ER didn't do anything for her. Attempted to reassure pt that we were able to r/o any emergency with the exam and labs that were ordered based on her symptoms and presentation. Pt states her GYN sent her here and she doesn't understand why he sent her if we weren't going to do anything.

## 2020-06-24 ENCOUNTER — Telehealth: Payer: Self-pay | Admitting: Obstetrics & Gynecology

## 2020-06-24 LAB — GC/CHLAMYDIA PROBE AMP (~~LOC~~) NOT AT ARMC
Chlamydia: NEGATIVE
Comment: NEGATIVE
Comment: NORMAL
Neisseria Gonorrhea: NEGATIVE

## 2020-06-24 NOTE — Telephone Encounter (Signed)
Pt having a lot of pain and pressure. Can't have sex due to pain. Was seen yesterday at [facility] ER. Pt is scheduled for 07/01/20 here. Pt was advised a pap was not done at the ER but I let pt know GC/CHL was negative and UPT was negative. Pt was advised to keep scheduled visit. Pt voiced understanding. JSY

## 2020-06-24 NOTE — Telephone Encounter (Signed)
Patient seen at ER recently, was told to check with [place] today for PAP results Patient is scheduled to see Dr. Alysia Penna 07/02/2020 but would like a return call to get test results, scared & wondering  Please advise

## 2020-07-02 ENCOUNTER — Encounter: Payer: Self-pay | Admitting: Obstetrics and Gynecology

## 2020-07-02 ENCOUNTER — Other Ambulatory Visit: Payer: Self-pay

## 2020-07-02 ENCOUNTER — Encounter: Payer: Medicaid Other | Admitting: Obstetrics and Gynecology

## 2020-07-02 ENCOUNTER — Ambulatory Visit (INDEPENDENT_AMBULATORY_CARE_PROVIDER_SITE_OTHER): Payer: Medicaid Other | Admitting: Obstetrics and Gynecology

## 2020-07-02 ENCOUNTER — Other Ambulatory Visit (HOSPITAL_COMMUNITY)
Admission: RE | Admit: 2020-07-02 | Discharge: 2020-07-02 | Disposition: A | Discharge status: Home / Self Care | Payer: Medicaid Other | Source: Ambulatory Visit | Attending: Obstetrics and Gynecology | Admitting: Obstetrics and Gynecology

## 2020-07-02 VITALS — BP 147/88 | HR 77 | Ht 62.0 in | Wt 135.0 lb

## 2020-07-02 DIAGNOSIS — Z01419 Encounter for gynecological examination (general) (routine) without abnormal findings: Secondary | ICD-10-CM

## 2020-07-02 DIAGNOSIS — N3020 Other chronic cystitis without hematuria: Secondary | ICD-10-CM

## 2020-07-02 DIAGNOSIS — N938 Other specified abnormal uterine and vaginal bleeding: Secondary | ICD-10-CM | POA: Diagnosis not present

## 2020-07-02 MED ORDER — NITROFURANTOIN MONOHYD MACRO 100 MG PO CAPS: ORAL_CAPSULE | ORAL | 0 refills | Status: DC

## 2020-07-02 MED ORDER — PHENAZOPYRIDINE HCL 200 MG PO TABS: 200.0000 mg | ORAL_TABLET | Freq: Three times a day (TID) | ORAL | 1 refills | Status: DC | PRN

## 2020-07-02 NOTE — Progress Notes (Signed)
Lauren Durham presents with several complaints. She has noted pelvic pain and pain with intercourse over the last several months. Feels lower abd cramps daily. Some urinary freq.  Seen in the ER over the weekend with negative STD testing  She has also noted that for the last 6 months her cycles have been heavier and more painful cramps. At times 2 cycles a month H/O bilateral salpingectomy 4 yrs ago  Last pap 2016 normal  PE AF VSS Lungs clear Heart RRR Abd soft + BS GU Nl EGBUS cervix, small Lauren Durham noted, pap smear obtained, uterus small < 8 weeks, non tender, no masses, + bladder tenderness  A/P Chronic Cystitis        DUB  Pap smear collected today. Macrobid and Pyridium for cystitis. Check GYN U/S. F/U in 4-6 weeks

## 2020-07-08 LAB — CYTOLOGY - PAP
Comment: NEGATIVE
Comment: NEGATIVE
Diagnosis: NEGATIVE
HPV 16: NEGATIVE
HPV 18 / 45: NEGATIVE
High risk HPV: POSITIVE — AB

## 2024-01-06 ENCOUNTER — Other Ambulatory Visit: Payer: Self-pay

## 2024-01-06 ENCOUNTER — Encounter (HOSPITAL_COMMUNITY): Payer: Self-pay | Admitting: Emergency Medicine

## 2024-01-06 ENCOUNTER — Emergency Department (HOSPITAL_COMMUNITY)
Admission: EM | Admit: 2024-01-06 | Discharge: 2024-01-07 | Disposition: A | Discharge status: Home / Self Care | Source: Home / Self Care | Attending: Emergency Medicine | Admitting: Emergency Medicine

## 2024-01-06 ENCOUNTER — Emergency Department (HOSPITAL_COMMUNITY)

## 2024-01-06 ENCOUNTER — Emergency Department (HOSPITAL_COMMUNITY)
Admission: EM | Admit: 2024-01-06 | Discharge: 2024-01-06 | Disposition: A | Discharge status: Against Medical Advice | Attending: Emergency Medicine | Admitting: Emergency Medicine

## 2024-01-06 DIAGNOSIS — K5900 Constipation, unspecified: Secondary | ICD-10-CM | POA: Insufficient documentation

## 2024-01-06 DIAGNOSIS — Z5321 Procedure and treatment not carried out due to patient leaving prior to being seen by health care provider: Secondary | ICD-10-CM | POA: Diagnosis not present

## 2024-01-06 DIAGNOSIS — R109 Unspecified abdominal pain: Secondary | ICD-10-CM | POA: Diagnosis present

## 2024-01-06 DIAGNOSIS — R062 Wheezing: Secondary | ICD-10-CM | POA: Insufficient documentation

## 2024-01-06 DIAGNOSIS — F17210 Nicotine dependence, cigarettes, uncomplicated: Secondary | ICD-10-CM | POA: Diagnosis not present

## 2024-01-06 DIAGNOSIS — R0602 Shortness of breath: Secondary | ICD-10-CM | POA: Insufficient documentation

## 2024-01-06 LAB — COMPREHENSIVE METABOLIC PANEL WITH GFR
ALT: 65 U/L — ABNORMAL HIGH (ref 0–44)
AST: 43 U/L — ABNORMAL HIGH (ref 15–41)
Albumin: 3.7 g/dL (ref 3.5–5.0)
Alkaline Phosphatase: 83 U/L (ref 38–126)
Anion gap: 12 (ref 5–15)
BUN: 11 mg/dL (ref 6–20)
CO2: 23 mmol/L (ref 22–32)
Calcium: 9 mg/dL (ref 8.9–10.3)
Chloride: 103 mmol/L (ref 98–111)
Creatinine, Ser: 0.76 mg/dL (ref 0.44–1.00)
GFR, Estimated: >60 mL/min (ref >60)
Glucose, Bld: 96 mg/dL (ref 70–99)
Potassium: 4.1 mmol/L (ref 3.5–5.1)
Sodium: 138 mmol/L (ref 135–145)
Total Bilirubin: 0.4 mg/dL (ref 0.0–1.2)
Total Protein: 6.6 g/dL (ref 6.5–8.1)

## 2024-01-06 LAB — CBC
HCT: 42.4 % (ref 36.0–46.0)
Hemoglobin: 14.2 g/dL (ref 12.0–15.0)
MCH: 31.7 pg (ref 26.0–34.0)
MCHC: 33.5 g/dL (ref 30.0–36.0)
MCV: 94.6 fL (ref 80.0–100.0)
Platelets: 180 10*3/uL (ref 150–400)
RBC: 4.48 MIL/uL (ref 3.87–5.11)
RDW: 13 % (ref 11.5–15.5)
WBC: 6.6 10*3/uL (ref 4.0–10.5)
nRBC: 0 % (ref 0.0–0.2)

## 2024-01-06 LAB — LIPASE, BLOOD: Lipase: 30 U/L (ref 11–51)

## 2024-01-06 LAB — URINALYSIS, ROUTINE W REFLEX MICROSCOPIC
Bilirubin Urine: NEGATIVE
Bilirubin Urine: NEGATIVE
Glucose, UA: NEGATIVE mg/dL
Glucose, UA: NEGATIVE mg/dL
Hgb urine dipstick: NEGATIVE
Hgb urine dipstick: NEGATIVE
Ketones, ur: NEGATIVE mg/dL
Ketones, ur: NEGATIVE mg/dL
Leukocytes,Ua: NEGATIVE
Leukocytes,Ua: NEGATIVE
Nitrite: NEGATIVE
Nitrite: NEGATIVE
Protein, ur: NEGATIVE mg/dL
Protein, ur: NEGATIVE mg/dL
Specific Gravity, Urine: 1.02 (ref 1.005–1.030)
Specific Gravity, Urine: 1.016 (ref 1.005–1.030)
pH: 5 (ref 5.0–8.0)
pH: 6 (ref 5.0–8.0)

## 2024-01-06 LAB — HCG, SERUM, QUALITATIVE: Preg, Serum: NEGATIVE

## 2024-01-06 NOTE — ED Triage Notes (Signed)
 Pt c/o constipation x 6 months, has had to use laxatives to have bowel movements.  Reports she had one small bowel movement yesterday but is now having abdominal pain and feels her stomach is distended

## 2024-01-06 NOTE — ED Triage Notes (Signed)
 Pt reports that she has been dealing w/ constipation X months and that she has to use laxatives to have a bowel movement.  She reports only a small bowel movement in the past week and increased bloating.  Pt states she now has upper abd pain that is causing her SOB.  "I can't walk from my living room to my bathroom w/o being sob.

## 2024-01-07 MED ORDER — POLYETHYLENE GLYCOL 3350 17 G PO PACK
17.0000 g | PACK | Freq: Every day | ORAL | 0 refills | Status: AC
Start: 2024-01-07 — End: ?

## 2024-01-07 MED ORDER — ALBUTEROL SULFATE HFA 108 (90 BASE) MCG/ACT IN AERS: 1.0000 | INHALATION_SPRAY | Freq: Four times a day (QID) | RESPIRATORY_TRACT | 5 refills | Status: AC | PRN

## 2024-01-07 MED ORDER — DOCUSATE SODIUM 100 MG PO CAPS: 100.0000 mg | ORAL_CAPSULE | Freq: Two times a day (BID) | ORAL | 0 refills | Status: DC

## 2024-01-07 MED ORDER — PREDNISONE 20 MG PO TABS
60.0000 mg | ORAL_TABLET | Freq: Once | ORAL | Status: AC
  Administered 2024-01-07: 60 mg via ORAL
  Filled 2024-01-07: qty 3

## 2024-01-07 MED ORDER — ALBUTEROL SULFATE (2.5 MG/3ML) 0.083% IN NEBU
5.0000 mg | INHALATION_SOLUTION | Freq: Once | RESPIRATORY_TRACT | Status: AC
  Administered 2024-01-07: 5 mg via RESPIRATORY_TRACT
  Filled 2024-01-07: qty 6

## 2024-01-07 MED ORDER — PREDNISONE 10 MG PO TABS: 30.0000 mg | ORAL_TABLET | Freq: Every day | ORAL | 0 refills | Status: AC

## 2024-01-07 MED ORDER — ALBUTEROL SULFATE HFA 108 (90 BASE) MCG/ACT IN AERS
2.0000 | INHALATION_SPRAY | Freq: Once | RESPIRATORY_TRACT | Status: AC
  Administered 2024-01-07: 2 via RESPIRATORY_TRACT
  Filled 2024-01-07: qty 6.7

## 2024-01-07 MED ORDER — IPRATROPIUM BROMIDE 0.02 % IN SOLN
0.5000 mg | Freq: Once | RESPIRATORY_TRACT | Status: AC
  Administered 2024-01-07: 0.5 mg via RESPIRATORY_TRACT
  Filled 2024-01-07: qty 2.5

## 2024-01-07 NOTE — ED Provider Notes (Signed)
 Accepted handoff at shift change from [facility], PA-C. Please see prior provider note for more detail.   Briefly: Patient is 43 y.o. here for constipation for months and shortness of breath.  DDX: concern for bowel obstruction, COPD  Plan: Given albuterol  and prednisone .  Reassess.  Likely discharge if feeling better.   Physical Exam  BP 99/61   Pulse 80   Temp 97.9 F (36.6 C) (Oral)   Resp 19   Ht 5\' 2"  (1.575 m)   Wt 61.2 kg   SpO2 96%   BMI 24.68 kg/m   Physical Exam  Procedures  Procedures  ED Course / MDM   Clinical Course as of 01/07/24 0815  Fri Jan 07, 2024  0608 Patient with constipation for months. Using OTC mag citrate. No surgeries. She moves her bowels a little at a time, hard stools. Doubt obstruction. Will provide Miralax .  [SU]  0610 She is wheezing. Afebrile. CXR clear. She has used inhalers infrequently in the past. Albuterol  inhaler ordered. Will reassess.  [SU]  [id number] Here for constipation. Now SOB. Wheezing. Smoker. Has used inhaler in the past. Getting neb and prednisone . Reassess. Likely d/c.May need pulm f/u. Unsure if has COPD/asthma diagnosis  [JR]  [phone] Patient still wheezing on recheck, not feeling much better. Nebulizer ordered, Prednisone . Anticipate discharge when improved. Patient care signed out to Valentina Gasman, PA-C at end of shift.  [SU]    Clinical Course User Index [JR] Janalee Mcmurray, PA-C [SU] Mandy Second, PA-C   Medical Decision Making Amount and/or Complexity of Data Reviewed Labs: ordered. Radiology: ordered.  Risk Prescription drug management.   Discussed smoking cessation with patient and was they were offerred resources to help stop.  Total time was 5 min CPT code 96045.   On reassessment, she states she was feeling much better.  Lung sounds were clear without wheezing did hear some diffuse coarse breath sounds but otherwise moving air really well.  Sent MiraLAX  and Colace to her pharmacy.  Sent albuterol  and  5-day course of prednisone  to pharmacy.  Advised her to follow-up with pulmonology as she is a smoker as well.  Did discuss smoking cessation.  Advised her to follow-up with her PCP for her likely chronic constipation.  Discussed return precautions.  Discharged in good condition.       Janalee Mcmurray, PA-C 01/07/24 Sergio Dandy    Trish Furl, MD 01/07/24 [phone]

## 2024-01-07 NOTE — Discharge Instructions (Addendum)
 Evaluation today was overall reassuring.  You responded well to steroids and albuterol  for your breathing issues.  Please follow-up with your pulmonologist.  For your constipation I feel it is likely chronic.  Sent Colace and MiraLAX  to your pharmacy.  Please follow-up with your PCP.  If you have worsening abdominal pain, nausea vomiting, cannot pass gas, develop a fever, have shortness of breath or chest pain or any other concerning symptom please return to the ED for further evaluation.

## 2024-01-07 NOTE — ED Provider Notes (Signed)
 [place] EMERGENCY DEPARTMENT AT [facility] Provider Note   CSN: [id number] Arrival date & time: 01/06/24  2101     History  Chief Complaint  Patient presents with   Shortness of Breath   Abdominal Pain    Lauren Durham is a 43 y.o. female.  Patient to ED with complaint of constipation for the last several months despite use of mag citrate. No previous abdominal surgeries. No vomiting or fever. No bloody stools. She states over the last several days she has been having SOB with little exertion. No cough or URI symptoms. She is a smoker and has been weaning off cigarettes, now down to 2 a day.   The history is provided by the patient. No language interpreter was used.  Shortness of Breath Associated symptoms: abdominal pain   Abdominal Pain Associated symptoms: shortness of breath        Home Medications Prior to Admission medications   Medication Sig Start Date End Date Taking? Authorizing Provider  cloNIDine (CATAPRES) 0.1 MG tablet Take 0.1 mg by mouth every 8 (eight) hours as needed. 10/13/23   [provider]  DULoxetine (CYMBALTA) 30 MG capsule Take 30 mg by mouth daily.    [provider]  gabapentin (NEURONTIN) 300 MG capsule Take 300 mg by mouth 3 (three) times daily as needed. 10/13/23   [provider]  HYDROcodone -acetaminophen  (NORCO/VICODIN) 5-325 MG tablet Take 1-2 tablets by mouth every 6 (six) hours as needed. 06/23/20   Zackowski, Scott, MD  hydrOXYzine (VISTARIL) 25 MG capsule Take 25-50 mg by mouth at bedtime as needed. 12/17/23   [provider]  Multiple Vitamin (MULTIVITAMIN) capsule Take 1 capsule by mouth daily.    [provider]  naloxone [facility]) nasal spray 4 mg/0.1 mL Place 1 spray into the nose as directed. 10/13/23   [provider]  nitrofurantoin , macrocrystal-monohydrate, (MACROBID ) 100 MG capsule Take 1 tablet by mouth bid x 7 days and then 1 tablet at bedtime x 21 days 07/02/20    Ervin, Michael L, MD  ondansetron  (ZOFRAN ) 8 MG tablet Take 8 mg by mouth every 8 (eight) hours as needed. 10/13/23   [provider]  phenazopyridine  (PYRIDIUM ) 200 MG tablet Take 1 tablet (200 mg total) by mouth 3 (three) times daily as needed for pain (urethral spasm). 07/02/20   Ervin, Michael L, MD  SUBOXONE 8-2 MG FILM Place 1 Film under the tongue 3 (three) times daily. 12/17/23   [provider]      Allergies    Motrin [ibuprofen], Tape, Keflex [cephalexin], Penicillins, and Sulfa antibiotics    Review of Systems   Review of Systems  Respiratory:  Positive for shortness of breath.   Gastrointestinal:  Positive for abdominal pain.    Physical Exam Updated Vital Signs BP 105/65   Pulse 71   Temp 98.2 F (36.8 C)   Resp 15   Ht 5\' 2"  (1.575 m)   Wt 61.2 kg   SpO2 98%   BMI 24.68 kg/m  Physical Exam Constitutional:      Appearance: She is well-developed.  HENT:     Head: Normocephalic.  Cardiovascular:     Rate and Rhythm: Normal rate and regular rhythm.     Heart sounds: No murmur heard. Pulmonary:     Effort: Pulmonary effort is normal.     Breath sounds: Wheezing and rhonchi present. No rales.     Comments: Wheezing bilaterally. Good air movement.  Abdominal:  General: Bowel sounds are normal.     Palpations: Abdomen is soft.     Tenderness: There is no abdominal tenderness. There is no guarding or rebound.  Musculoskeletal:        General: Normal range of motion.     Cervical back: Normal range of motion and neck supple.  Skin:    General: Skin is warm and dry.  Neurological:     General: No focal deficit present.     Mental Status: She is alert and oriented to person, place, and time.     ED Results / Procedures / Treatments   Labs (all labs ordered are listed, but only abnormal results are displayed) Labs Reviewed  COMPREHENSIVE METABOLIC PANEL WITH GFR - Abnormal; Notable for the following components:      Result Value   AST 43  (*)    ALT 65 (*)    All other components within normal limits  URINALYSIS, ROUTINE W REFLEX MICROSCOPIC - Abnormal; Notable for the following components:   APPearance HAZY (*)    All other components within normal limits  LIPASE, BLOOD  CBC  HCG, SERUM, QUALITATIVE    EKG None  Radiology DG Chest 2 View Result Date: 01/06/2024 CLINICAL DATA:  Shortness of breath EXAM: CHEST - 2 VIEW COMPARISON:  Chest x-ray 03/05/2016 FINDINGS: The heart size and mediastinal contours are within normal limits. Both lungs are clear. The visualized skeletal structures are unremarkable. IMPRESSION: No active cardiopulmonary disease. Electronically Signed   By: Tyron Gallon M.D.   On: 01/06/2024 22:31    Procedures Procedures    Medications Ordered in ED Medications  albuterol  (PROVENTIL ) (2.5 MG/3ML) 0.083% nebulizer solution 5 mg (has no administration in time range)  ipratropium (ATROVENT ) nebulizer solution 0.5 mg (has no administration in time range)  predniSONE  (DELTASONE ) tablet 60 mg (has no administration in time range)  albuterol  (VENTOLIN  HFA) 108 (90 Base) MCG/ACT inhaler 2 puff (2 puffs Inhalation Given 01/07/24 0558)    ED Course/ Medical Decision Making/ A&P Clinical Course as of 01/07/24 0639  Fri Jan 07, 2024  0608 Patient with constipation for months. Using OTC mag citrate. No surgeries. She moves her bowels a little at a time, hard stools. Doubt obstruction. Will provide Miralax .  [SU]  [id number] She is wheezing. Afebrile. CXR clear. She has used inhalers infrequently in the past. Albuterol  inhaler ordered. Will reassess.  [SU]  [id number] Here for constipation. Now SOB. Wheezing. Smoker. Has used inhaler in the past. Getting neb and prednisone . Reassess. Likely d/c.May need pulm f/u. Unsure if has COPD/asthma diagnosis  [JR]  [phone] Patient still wheezing on recheck, not feeling much better. Nebulizer ordered, Prednisone . Anticipate discharge when improved. Patient care signed out to Valentina Gasman, PA-C at end of shift.  [SU]    Clinical Course User Index [JR] Janalee Mcmurray, PA-C [SU] Mandy Second, PA-C                                 Medical Decision Making Amount and/or Complexity of Data Reviewed Labs: ordered. Radiology: ordered.  Risk Prescription drug management.           Final Clinical Impression(s) / ED Diagnoses Final diagnoses:  Wheezing  Constipation, unspecified constipation type    Rx / DC Orders ED Discharge Orders     None         Mandy Second, PA-C 01/07/24 [id number]    Monique Ano,  Nellie Banas, MD 01/07/24 [phone]

## 2024-01-14 DIAGNOSIS — J189 Pneumonia, unspecified organism: Secondary | ICD-10-CM | POA: Insufficient documentation

## 2024-01-14 DIAGNOSIS — J449 Chronic obstructive pulmonary disease, unspecified: Secondary | ICD-10-CM | POA: Insufficient documentation

## 2024-01-14 DIAGNOSIS — F1191 Opioid use, unspecified, in remission: Secondary | ICD-10-CM | POA: Insufficient documentation

## 2024-02-06 NOTE — Progress Notes (Deleted)
 Lauren Durham, female    DOB: July 29, 1981    MRN: [id number]   Brief patient profile:  37  yowf *** referred to pulmonary clinic in [place]  02/08/2024 by *** for ***   Pt not previously seen by PCCM service.     History of Present Illness  02/08/2024  Pulmonary/ 1st office eval/ [name] / [place] [facility]  No chief complaint on file.    Dyspnea:  *** Cough: *** Sleep: *** SABA use: *** 02: *** LDSCT:***  No obvious day to day or daytime pattern/variability or assoc excess/ purulent sputum or mucus plugs or hemoptysis or cp or chest tightness, subjective wheeze or overt sinus or hb symptoms.    Also denies any obvious fluctuation of symptoms with weather or environmental changes or other aggravating or alleviating factors except as outlined above   No unusual exposure hx or h/o childhood pna/ asthma or knowledge of premature birth.  Current Allergies, Complete Past Medical History, Past Surgical History, Family History, and Social History were reviewed in [employer] record.  ROS  The following are not active complaints unless bolded Hoarseness, sore throat, dysphagia, dental problems, itching, sneezing,  nasal congestion or discharge of excess mucus or purulent secretions, ear ache,   fever, chills, sweats, unintended wt loss or wt gain, classically pleuritic or exertional cp,  orthopnea pnd or arm/hand swelling  or leg swelling, presyncope, palpitations, abdominal pain, anorexia, nausea, vomiting, diarrhea  or change in bowel habits or change in bladder habits, change in stools or change in urine, dysuria, hematuria,  rash, arthralgias, visual complaints, headache, numbness, weakness or ataxia or problems with walking or coordination,  change in mood or  memory.            Outpatient Medications Prior to Visit  Medication Sig Dispense Refill   albuterol  (VENTOLIN  HFA) 108 (90 Base) MCG/ACT inhaler Inhale 1-2 puffs into the lungs every 6 (six) hours as  needed for wheezing or shortness of breath. 1 each 5   cloNIDine (CATAPRES) 0.1 MG tablet Take 0.1 mg by mouth every 8 (eight) hours as needed.     docusate sodium  (COLACE) 100 MG capsule Take 1 capsule (100 mg total) by mouth every 12 (twelve) hours. 60 capsule 0   DULoxetine (CYMBALTA) 30 MG capsule Take 30 mg by mouth daily.     gabapentin (NEURONTIN) 300 MG capsule Take 300 mg by mouth 3 (three) times daily as needed.     HYDROcodone -acetaminophen  (NORCO/VICODIN) 5-325 MG tablet Take 1-2 tablets by mouth every 6 (six) hours as needed. 10 tablet 0   hydrOXYzine (VISTARIL) 25 MG capsule Take 25-50 mg by mouth at bedtime as needed.     Multiple Vitamin (MULTIVITAMIN) capsule Take 1 capsule by mouth daily.     naloxone (NARCAN) nasal spray 4 mg/0.1 mL Place 1 spray into the nose as directed.     ondansetron  (ZOFRAN ) 8 MG tablet Take 8 mg by mouth every 8 (eight) hours as needed.     polyethylene glycol (MIRALAX ) 17 g packet Take 17 g by mouth daily. 14 each 0   SUBOXONE 8-2 MG FILM Place 1 Film under the tongue 3 (three) times daily.     nitrofurantoin , macrocrystal-monohydrate, (MACROBID ) 100 MG capsule Take 1 tablet by mouth bid x 7 days and then 1 tablet at bedtime x 21 days 35 capsule 0   phenazopyridine  (PYRIDIUM ) 200 MG tablet Take 1 tablet (200 mg total) by mouth 3 (three) times daily as needed for  pain (urethral spasm). 10 tablet 1   No facility-administered medications prior to visit.    Past Medical History:  Diagnosis Date   Anemia    "low iron", anemic as a child   Anxiety    not treated at present time   Bradycardia    Cervical dysplasia    Depression    not treated at present time   GERD (gastroesophageal reflux disease)    Headache(784.0)    gets headache with menstrual period   History of UTI 06/25/2015   Hypertension    Kidney infection    has had several   Kidney stones    Mitral valve prolapse    MVP (mitral valve prolapse)    PE (pulmonary embolism)     Polycystic ovary syndrome    PONV (postoperative nausea and vomiting)    Pregnant 06/25/2015   Pyelonephritis    Seizures (HCC) 05/2013   potassium was low, blood sugar was low;no further meds or seizure   Vaginal Pap smear, abnormal       Objective:     There were no vitals taken for this visit.         Assessment   No problem-specific Assessment & Plan notes found for this encounter.     Vernestine Gondola, MD 02/06/2024

## 2024-02-08 ENCOUNTER — Ambulatory Visit: Admitting: Internal Medicine

## 2024-03-28 ENCOUNTER — Telehealth (INDEPENDENT_AMBULATORY_CARE_PROVIDER_SITE_OTHER): Payer: Self-pay | Admitting: Internal Medicine

## 2024-03-28 ENCOUNTER — Encounter: Payer: Self-pay | Admitting: Internal Medicine

## 2024-03-28 ENCOUNTER — Ambulatory Visit: Admitting: Internal Medicine

## 2024-03-28 ENCOUNTER — Ambulatory Visit (HOSPITAL_COMMUNITY)
Admission: RE | Admit: 2024-03-28 | Discharge: 2024-03-28 | Disposition: A | Discharge status: Home / Self Care | Source: Ambulatory Visit | Attending: Internal Medicine | Admitting: Internal Medicine

## 2024-03-28 VITALS — BP 118/76 | HR 84 | Ht 62.0 in | Wt 189.0 lb

## 2024-03-28 DIAGNOSIS — J449 Chronic obstructive pulmonary disease, unspecified: Secondary | ICD-10-CM

## 2024-03-28 DIAGNOSIS — Z87891 Personal history of nicotine dependence: Secondary | ICD-10-CM

## 2024-03-28 DIAGNOSIS — R0609 Other forms of dyspnea: Secondary | ICD-10-CM

## 2024-03-28 DIAGNOSIS — J9601 Acute respiratory failure with hypoxia: Secondary | ICD-10-CM | POA: Diagnosis not present

## 2024-03-28 DIAGNOSIS — J189 Pneumonia, unspecified organism: Secondary | ICD-10-CM

## 2024-03-28 NOTE — Assessment & Plan Note (Signed)
 See admit May 2025 [facility] -  03/28/2024 no desats fast pace so rec just uset 2lpm hs and prn   She has recovered from apparent CAP and likely only has mild copd so rx as above and f/u in 6 weeks with pfts / continue off cigs  Each maintenance medication was reviewed in detail including emphasizing most importantly the difference between maintenance and prns and under what circumstances the prns are to be triggered using an action plan format where appropriate.  Total time for H and P, chart review, counseling, reviewing hfa device(s) , directly observing portions of ambulatory 02 saturation study/ and generating customized AVS unique to this office visit / same day charting = 45 min new pt eval

## 2024-03-28 NOTE — Assessment & Plan Note (Signed)
 Onset with CAP admit May 2025 [facility] [place] - 03/28/2024   Walked on RA  x  3  lap(s) =  approx 450  ft  @ fast pace, stopped due to end of study  with lowest 02 sats 91%    She may have mild AB but really no features of copd so for now rec saba prn and f/u with pfts in 6 weeks  Re SABA :  I spent extra time with pt today reviewing appropriate use of albuterol  for prn use on exertion with the following points: 1) saba is for relief of sob that does not improve by walking a slower pace or resting but rather if the pt does not improve after trying this first. 2) If the pt is convinced, as many are, that saba helps recover from activity faster then it's easy to tell if this is the case by re-challenging : ie stop, take the inhaler, then p 5 minutes try the exact same activity (intensity of workload) that just caused the symptoms and see if they are substantially diminished or not after saba 3) if there is an activity that reproducibly causes the symptoms, try the saba 15 min before the activity on alternate days   If in fact the saba really does help, then fine to continue to use it prn but advised may need to look closer at the maintenance regimen being used to achieve better control of airways disease with exertion.

## 2024-03-28 NOTE — Patient Instructions (Addendum)
 Work on inhaler technique:  relax and gently blow all the way out then take a nice smooth full deep breath back in, triggering the inhaler at same time you start breathing in.  Hold breath in for at least  5 seconds if you can   Use your albuterol  as a rescue medication to be used if you can't catch your breath by resting or doing a relaxed purse lip breathing pattern.  - The less you use it, the better it will work when you need it. - Ok to use up to 2 puffs  every 4 hours if you must but call for immediate appointment if use goes up over your usual need - Don't leave home without it !!  (think of it like starter fluid for an engine)  Also  Ok to try albuterol  15 min before an activity (on alternating days)  that you know would usually make you short of breath and see if it makes any difference and if makes none then don't take albuterol  after activity unless you can't catch your breath as this means it's the resting that helps, not the albuterol .   Wear 2lpm  at bedtime and any time you feel you need it or according to your 02 saturation:  but make sure you check your oxygen saturation  AT  your highest level of activity (not after you stop)   to be sure it stays over 90% and adjust  02 flow upward to maintain this level if needed but remember to turn it back to previous settings when you stop (to conserve your supply).   Please schedule a follow up office visit in 6 weeks, call sooner if needed with all medications /inhalers/ solutions in hand so we can verify exactly what you are taking. This includes all medications from all doctors and over the counters

## 2024-03-28 NOTE — Telephone Encounter (Signed)
Needs pfts next ov

## 2024-03-28 NOTE — Progress Notes (Signed)
 Lauren Durham, female    DOB: 1980-09-25    MRN: [id number]   Brief patient profile:  43   yowf  quit smoking mid May 2025  referred to pulmonary clinic in [place]  03/28/2024 by [facility] hospitalists  for post hop f/u    Discharge date:                                 Jan 16, 2024 Length of stay:                                   LOS: 2 days    Discharge Service:                           [facility] Hospitalists Discharge Attending Physician:     Lauren Marice Hurst, PA Discharge to:                                     To Home Condition at Discharge:                   good     Mental Status On day of Discharge:  The patient is Alert and oriented to PERSON The patient is Alert And oriented to TIME The patient is Alert and oriented to [facility] course based on timeline of significant events after admission (by date): 01/14/2024: Patient rates her breathing challenges as a 10 on a scale of 0-10 when she was admitted.  This morning on rounds, she is only improved to 8 on a scale of 0-10.  She is unable to talk in complete sentences and using accessory muscles of her chest and neck to take in deep breaths.  She gets short of breath with movement in her bed.  Patient requires oxygen and is saturating in the low to mid 90s   01/15/2024: On morning rounds today, patient continues to use chest and neck accessory muscles to breathe.  She is benefiting from DuoNebs every 4 hours which we will continue.  She has improved some and her breathing discomfort/challenges are rated 5 on a scale of 0-10.  Yesterday she was 8 and on admission she was at 10.  However, she will need to spend tonight with us  because she still has rales and rhonchi on her lung exam as well as shortness of breath.  She is requiring 3 L and oxygenating between 93 to 95%    ______________________________________     Admission HPI    Patient admitted on: 01/13/2024 10:51 PM  Patient admitted by: Lauren Roger, DO   CHIEF COMPLAINT: Short of breath   Day of admission HPI:    This is a 43 y.o. female with a known history of MVP, PE after pregnancy (10 yrs ago), opioid use disorder on Suboxone presents to the emergency department for evaluation of  one month history of shortness of breath, chills.  Was taking OTC allergy medications without relief.  Was seen in [facility] ER one week ago, was diagnosed with COPD and discharged home.  Symptoms worsened over the past week including cough, congestionto the point where she was coughing so badly in her shower that she got dizzy, fell and hit her head, no  LOC.  She also reports tightness in her neck and upper back associated with coughing.   Of note quit smoking one week ago.    Otherwise there has been no change in status. Patient has been taking medication as prescribed and there has been no recent change in medication or diet. There has been no recent illness/hospitalizations, travel or sick contacts.  Patient denies weakness, dizziness, chest pain,  N/V/C/D, abdominal pain, dysuria/frequency, changes in mental status.  In the ED the patient received azactam, levaquin, methylpred, duoneb.   Medical admission was requested for further workup and management of Multifocal pneumonia.    Patient admitted on Home O2? - No Patient on home anticoagulant? -  No Patient admitted with Chronic home foley catheter? - No Foley catheter placed or replaced by another service prior to admission? - No   Mental Status on Admission: The patient is Alert and oriented to PERSON The patient is Alert And oriented to TIME The patient is Alert and oriented to LOCATION     Problem List, Assessment & Plan     ASSESSMENT & PLAN (In order of descending acuity)   43 y.o. y.o. female with a known history of MVP, PE after pregnancy (10 yrs ago), opioid use disorder on Suboxone now admitted with   Multifocal pneumonia - Admit IP - O2 nebs expectorants PRN - Continue Levaquin -  Check sputum culture and resp path panel   COPD - Continue methylpred - Will need to establish with outpatient pulm   History of tobacco use, quit one week ago - Nicotine  patch - Cessation advised   History of opioid use disorder in remission - Hold any oral opiates - Pain control with NSAIDs - Resume suboxone   Admission status: IP IV Fluids: LR Diet/Nutrition: Regular Consults: PT DVT Px:  SCDs and early ambulation Code Status: Full Code Disposition Plan: To home in 1-2 days   ADDITIONAL PATIENT FINDINGS OR OBSERVATIONS   Patient will be discharged today on the following medications: Levaquin 750 mg daily x 10 days Over-the-counter Robitussin DM Prednisone  20 mg x 5 days DuoNeb every 6 hours as needed for COPD exacerbation/shortness of breath Nicotine  patch x 1 month Doxycycline 100 mg twice daily x 10 days Case management will set up pulmonology appointment   Labs from 01/16/2024 BMP = within normal limits except glucose is elevated 212.  Patient is getting steroids. CBC shows WBC = 13.3 (was 12.5 yesterday).  Patient is getting steroids.   Patient is clinically stable to be discharged home. On exam, patient is not using any accessory muscles to breathe. She is not in any respiratory distress. Lung sounds are clear.   DVT prophylaxis while in hospital:             pneumatic compression device          History of Present Illness  03/28/2024  Pulmonary/ 1st office eval/ Lauren Durham / [facility] Office  Chief Complaint  Patient presents with   Pulmonary Consult    Referred by Lauren Hurst, PA. Pt hospitalized for pna end of May 2025. She c/o DOE since then and states has to sleep sitting up. She has recently had some swelling in her legs.   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    Also denies any obvious fluctuation of symptoms with weather or environmental changes or other aggravating or alleviating factors except as outlined above   No unusual exposure hx or h/o childhood pna/ asthma or knowledge of premature birth.  Current Allergies, Complete Past Medical History, Past Surgical History, Family History, and Social History were reviewed in [employer] record.  ROS  The following are not active complaints unless bolded Hoarseness, sore throat, dysphagia, dental problems, itching, sneezing,  nasal congestion or discharge of excess mucus or purulent secretions, ear ache,   fever, chills, sweats, unintended wt loss or wt gain, classically pleuritic or exertional cp,  orthopnea pnd or arm/hand swelling  or leg swelling, presyncope, palpitations, abdominal pain, anorexia, nausea, vomiting, diarrhea  or change in bowel habits or change in bladder habits, change in stools or change in urine, dysuria, hematuria,  rash, arthralgias, visual complaints, headache, numbness, weakness or ataxia or problems with walking or coordination,  change in mood or  memory.            Outpatient Medications Prior to Visit  Medication Sig Dispense Refill   albuterol  (VENTOLIN  HFA) 108 (90 Base) MCG/ACT inhaler Inhale 1-2 puffs into the lungs every 6 (six) hours as needed for wheezing or shortness of breath. 1 each 5   hydrOXYzine (VISTARIL) 25 MG capsule Take 25-50 mg by mouth at bedtime as needed.     Multiple Vitamin (MULTIVITAMIN) capsule Take 1 capsule by mouth daily.     naloxone (NARCAN) nasal spray 4 mg/0.1 mL Place 1 spray into the nose as directed.     nicotine  (NICODERM CQ  - DOSED IN MG/24 HOURS) 14 mg/24hr patch Place 14 mg onto the skin daily.     ondansetron  (ZOFRAN ) 8 MG tablet Take 8 mg by mouth  every 8 (eight) hours as needed.     polyethylene glycol (MIRALAX ) 17 g packet Take 17 g by mouth daily. 14 each 0   SUBOXONE 8-2 MG FILM Place 1 Film under the tongue 3 (three) times daily.     cloNIDine (CATAPRES) 0.1 MG tablet Take 0.1 mg by mouth every 8 (eight) hours as needed.     docusate sodium  (COLACE) 100 MG capsule Take 1 capsule (100 mg total) by mouth every 12 (twelve) hours. 60 capsule 0   DULoxetine (CYMBALTA) 30 MG capsule Take 30 mg by mouth daily. (Patient not taking: Reported on 03/28/2024)     gabapentin (NEURONTIN) 300 MG capsule Take 300 mg by mouth 3 (three) times daily as needed. (Patient not taking: Reported on 03/28/2024)     HYDROcodone -acetaminophen  (NORCO/VICODIN) 5-325 MG tablet Take 1-2 tablets by mouth every 6 (six) hours as needed. (Patient not taking: Reported on 03/28/2024) 10 tablet 0   ipratropium-albuterol  (DUONEB) 0.5-2.5 (3) MG/3ML SOLN Inhale into the lungs. (Patient not taking: Reported on 03/28/2024)     No facility-administered medications prior to visit.    Past Medical History:  Diagnosis Date   Anemia    low iron, anemic as a child   Anxiety    not treated at present time   Bradycardia    Cervical dysplasia    Depression    not treated at present time   GERD (gastroesophageal reflux disease)    Headache(784.0)    gets headache with menstrual  period   History of UTI 06/25/2015   Hypertension    Kidney infection    has had several   Kidney stones    Mitral valve prolapse    MVP (mitral valve prolapse)    PE (pulmonary embolism)    Polycystic ovary syndrome    PONV (postoperative nausea and vomiting)    Pregnant 06/25/2015   Pyelonephritis    Seizures (HCC) 05/2013   potassium was low, blood sugar was low;no further meds or seizure   Vaginal Pap smear, abnormal       Objective:     BP 118/76 (BP Location: Left Arm, Cuff Size: Normal)   Pulse 84   Ht 5' 2 (1.575 m)   Wt 189 lb (85.7 kg)   SpO2 99% Comment: on RA  BMI 34.57  kg/m   SpO2: 99 % (on RA)  Amb verbose wm nad    HEENT : Oropharynx  clear/ no upper teeth and 4/6 lower poor shape     Nasal turbinates nl    NECK :  without  apparent JVD/ palpable Nodes/TM    LUNGS: no acc muscle use,  Nl contour chest which is clear to A and P bilaterally without cough on insp or exp maneuvers   CV:  RRR  no s3 or murmur or increase in P2, and no edema   ABD:  soft and nontender   MS:  Gait nl   ext warm without deformities Or obvious joint restrictions  calf tenderness, cyanosis or clubbing    SKIN: warm and dry without lesions    NEURO:  alert, approp, nl sensorium with  no motor or cerebellar deficits apparent.   CXR PA and Lateral:   03/28/2024 :    I personally reviewed images and impression is as follows:     Clear lung fields      Assessment   DOE (dyspnea on exertion) Onset with CAP admit May 2025 [facility] [place] - 03/28/2024   Walked on RA  x  3  lap(s) =  approx 450  ft  @ fast pace, stopped due to end of study  with lowest 02 sats 91%    She may have mild AB but really no features of copd so for now rec saba prn and f/u with pfts in 6 weeks  Re SABA :  I spent extra time with pt today reviewing appropriate use of albuterol  for prn use on exertion with the following points: 1) saba is for relief of sob that does not improve by walking a slower pace or resting but rather if the pt does not improve after trying this first. 2) If the pt is convinced, as many are, that saba helps recover from activity faster then it's easy to tell if this is the case by re-challenging : ie stop, take the inhaler, then p 5 minutes try the exact same activity (intensity of workload) that just caused the symptoms and see if they are substantially diminished or not after saba 3) if there is an activity that reproducibly causes the symptoms, try the saba 15 min before the activity on alternate days   If in fact the saba really does help, then fine to continue to use it prn  but advised may need to look closer at the maintenance regimen being used to achieve better control of airways disease with exertion.   Acute hypoxemic respiratory failure [facility]) See admit May 2025 [facility] -  03/28/2024 no desats fast pace  so rec just uset 2lpm hs and prn   She has recovered from apparent CAP and likely only has mild copd so rx as above and f/u in 6 weeks with pfts / continue off cigs  Each maintenance medication was reviewed in detail including emphasizing most importantly the difference between maintenance and prns and under what circumstances the prns are to be triggered using an action plan format where appropriate.  Total time for H and P, chart review, counseling, reviewing hfa device(s) , directly observing portions of ambulatory 02 saturation study/ and generating customized AVS unique to this office visit / same day charting = 45 min new pt eval                  Ozell America, MD 03/28/2024

## 2024-03-31 ENCOUNTER — Ambulatory Visit: Payer: Self-pay | Admitting: Internal Medicine

## 2024-03-31 LAB — CBC WITH DIFFERENTIAL/PLATELET
Basophils Absolute: 0.1 x10E3/uL (ref 0.0–0.2)
Basos: 1 %
EOS (ABSOLUTE): 0.3 x10E3/uL (ref 0.0–0.4)
Eos: 4 %
Hematocrit: 43.5 % (ref 34.0–46.6)
Hemoglobin: 15 g/dL (ref 11.1–15.9)
Immature Grans (Abs): 0 x10E3/uL (ref 0.0–0.1)
Immature Granulocytes: 0 %
Lymphocytes Absolute: 1.9 x10E3/uL (ref 0.7–3.1)
Lymphs: 29 %
MCH: 31.9 pg (ref 26.6–33.0)
MCHC: 34.5 g/dL (ref 31.5–35.7)
MCV: 93 fL (ref 79–97)
Monocytes Absolute: 0.5 x10E3/uL (ref 0.1–0.9)
Monocytes: 8 %
Neutrophils Absolute: 3.6 x10E3/uL (ref 1.4–7.0)
Neutrophils: 58 %
Platelets: 223 x10E3/uL (ref 150–450)
RBC: 4.7 x10E6/uL (ref 3.77–5.28)
RDW: 13.1 % (ref 11.7–15.4)
WBC: 6.4 x10E3/uL (ref 3.4–10.8)

## 2024-03-31 LAB — HEPATIC FUNCTION PANEL
ALT: 23 IU/L (ref 0–32)
AST: 25 IU/L (ref 0–40)
Albumin: 4.5 g/dL (ref 3.9–4.9)
Alkaline Phosphatase: 103 IU/L (ref 44–121)
Bilirubin Total: 0.3 mg/dL (ref 0.0–1.2)
Bilirubin, Direct: 0.11 mg/dL (ref 0.00–0.40)
Total Protein: 7.1 g/dL (ref 6.0–8.5)

## 2024-03-31 LAB — BASIC METABOLIC PANEL WITH GFR
BUN/Creatinine Ratio: 9 (ref 9–23)
BUN: 7 mg/dL (ref 6–24)
CO2: 22 mmol/L (ref 20–29)
Calcium: 9.3 mg/dL (ref 8.7–10.2)
Chloride: 101 mmol/L (ref 96–106)
Creatinine, Ser: 0.81 mg/dL (ref 0.57–1.00)
Glucose: 92 mg/dL (ref 70–99)
Potassium: 4.1 mmol/L (ref 3.5–5.2)
Sodium: 140 mmol/L (ref 134–144)
eGFR: 92 mL/min/1.73 (ref >59)

## 2024-03-31 LAB — BRAIN NATRIURETIC PEPTIDE: BNP: 64.6 pg/mL (ref 0.0–100.0)

## 2024-03-31 LAB — ALPHA-1-ANTITRYPSIN PHENOTYP: A-1 Antitrypsin: 164 mg/dL (ref 101–187)

## 2024-03-31 LAB — SEDIMENTATION RATE: Sed Rate: 22 mm/h (ref 0–32)

## 2024-03-31 LAB — TSH: TSH: 1.24 u[IU]/mL (ref 0.450–4.500)

## 2024-03-31 LAB — D-DIMER, QUANTITATIVE: D-DIMER: 0.46 mg{FEU}/L (ref 0.00–0.49)

## 2024-04-03 NOTE — Telephone Encounter (Signed)
 LVM for respiratory therapy to call and schedule ---will also send secure chat

## 2024-04-03 NOTE — Telephone Encounter (Signed)
 Spoke with patient regarding the 05/16/24 2:00pm PFT scheduled at [facility] 1:45 pm --1st floor registration desk for check in--follow up with Dr. Darlean rescheduled to Tuesday 05/23/24 at 1:00 pm.  Will mail information to patient and she voiced her understanding

## 2024-04-03 NOTE — Progress Notes (Signed)
 Spoke with pt regarding results. Confirmed understanding. NFN

## 2024-04-03 NOTE — Telephone Encounter (Signed)
 [place], the PFT order is in, can you please schedule her for one prior to next visit with Dr. Darlean? Thanks!

## 2024-05-09 ENCOUNTER — Ambulatory Visit: Admitting: Internal Medicine

## 2024-05-16 ENCOUNTER — Ambulatory Visit (HOSPITAL_COMMUNITY): Admission: RE | Admit: 2024-05-16 | Source: Ambulatory Visit

## 2024-05-22 ENCOUNTER — Ambulatory Visit (HOSPITAL_COMMUNITY)
Admission: RE | Admit: 2024-05-22 | Discharge: 2024-05-22 | Disposition: A | Discharge status: Home / Self Care | Source: Ambulatory Visit | Attending: Internal Medicine | Admitting: Internal Medicine

## 2024-05-22 DIAGNOSIS — R0609 Other forms of dyspnea: Secondary | ICD-10-CM | POA: Diagnosis present

## 2024-05-22 LAB — PULMONARY FUNCTION TEST
DL/VA % pred: 55 %
DL/VA: 2.48 ml/min/mmHg/L
DLCO unc % pred: 51 %
DLCO unc: 10.31 ml/min/mmHg
FEF 25-75 Post: 0.77 L/s
FEF 25-75 Pre: 1.05 L/s
FEF2575-%Change-Post: -26 %
FEF2575-%Pred-Post: 26 %
FEF2575-%Pred-Pre: 36 %
FEV1-%Change-Post: -5 %
FEV1-%Pred-Post: 69 %
FEV1-%Pred-Pre: 73 %
FEV1-Post: 1.88 L
FEV1-Pre: 1.99 L
FEV1FVC-%Change-Post: 1 %
FEV1FVC-%Pred-Pre: 80 %
FEV6-%Change-Post: -5 %
FEV6-%Pred-Post: 83 %
FEV6-%Pred-Pre: 89 %
FEV6-Post: 2.76 L
FEV6-Pre: 2.93 L
FEV6FVC-%Change-Post: 0 %
FEV6FVC-%Pred-Post: 99 %
FEV6FVC-%Pred-Pre: 98 %
FVC-%Change-Post: -6 %
FVC-%Pred-Post: 84 %
FVC-%Pred-Pre: 90 %
FVC-Post: 2.83 L
FVC-Pre: 3.04 L
Post FEV1/FVC ratio: 66 %
Post FEV6/FVC ratio: 97 %
Pre FEV1/FVC ratio: 66 %
Pre FEV6/FVC Ratio: 97 %
RV % pred: 105 %
RV: 1.6 L
TLC % pred: 98 %
TLC: 4.61 L

## 2024-05-22 MED ORDER — ALBUTEROL SULFATE (2.5 MG/3ML) 0.083% IN NEBU
2.5000 mg | INHALATION_SOLUTION | Freq: Once | RESPIRATORY_TRACT | Status: AC
  Administered 2024-05-22: 2.5 mg via RESPIRATORY_TRACT

## 2024-05-23 ENCOUNTER — Ambulatory Visit: Admitting: Internal Medicine

## 2024-05-25 ENCOUNTER — Encounter: Payer: Self-pay | Admitting: Internal Medicine

## 2024-05-25 ENCOUNTER — Ambulatory Visit: Payer: Self-pay | Admitting: Internal Medicine

## 2024-05-25 NOTE — Progress Notes (Signed)
 Spoke with pt relayed results and she confirmed understanding. nfn

## 2024-06-01 NOTE — Progress Notes (Signed)
 Subjective:  Patient ID: Lauren Durham, female    DOB: Aug 28, 1981, 43 y.o.   MRN: [id number]  Patient Care Team: Deitra Morton Sebastian Nena, NP as PCP - General (Nurse Practitioner) Darlean Ozell NOVAK, MD as Consulting Physician (Pulmonary Disease)   Chief Complaint:  Establish Care and Migraine (Frequent migraines in past few weeks )   HPI: Lauren Durham is a 43 y.o. female presenting on 06/05/2024 for Establish Care and Migraine (Frequent migraines in past few weeks )    Discussed the use of AI scribe software for clinical note transcription with the patient, who gave verbal consent to proceed.  History of Present Illness Lauren Durham is a 43 year old female with COPD who presents to establish care and address multiple health concerns.  Recently diagnosed with COPD, she has been using oxygen therapy since hospitalization for pneumonia in late April to early May. Oxygen is used at night and during activities when she experiences dyspnea. Despite this, she has significant edema and has gained weight from 125-130 pounds to 197 pounds since February. Edema worsens without Lasix , causing her legs to feel like they are sunburned.  She quit smoking three months ago after a history of smoking a pack a day. She continues to have respiratory symptoms, including productive cough with mucus, which she attributes to lung healing. Allergy symptoms include swelling and itchiness over her eyebrows, nasal congestion, and an itchy throat, managed with over-the-counter Zyrtec.  She experiences significant anxiety and panic attacks, related to stress from her children being in foster care. Currently on Cymbalta and hydroxyzine for anxiety and depression, she struggles with insomnia, averaging four hours of sleep per night with frequent awakenings.  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 She is in the process of regaining custody of her two younger children,  contributing to her emotional distress.  She has a history of migraines since childhood, worsening over the past two months. In the last two weeks, she experienced five severe migraines, one lasting three days, characterized by pain behind the eyes, neck tension, and photophobia and phonophobia. She manages them with Tylenol  and ibuprofen.  She reports irregular menstrual cycles, with only one period in the past year, raising concerns about menopause. She also notes decreased libido, affecting her marriage.  She does not consume alcohol and has never been a drinker. She takes a multivitamin and has had allergic reactions to nicotine  patches.       06/05/2024    3:00 PM 07/02/2020    3:20 PM  GAD 7 : Generalized Anxiety Score  Nervous, Anxious, on Edge 3 2  Control/stop worrying 3 2  Worry too much - different things 3 2  Trouble relaxing 3 2  Restless 1 2  Easily annoyed or irritable 2 2  Afraid - awful might happen 3 2  Total GAD 7 Score 18 14  Anxiety Difficulty Very difficult Somewhat difficult     Flowsheet Row Office Visit from 06/05/2024 in [facility]  PHQ-9 Total Score 15     Relevant past medical, surgical, family, and social history reviewed and updated as indicated.  Allergies and medications reviewed and updated. Data reviewed: Chart in Epic.   Past Medical History:  Diagnosis Date   Anemia    low iron, anemic as a child   Anxiety    not treated at present time   Bradycardia    Cervical dysplasia    Depression  not treated at present time   GERD (gastroesophageal reflux disease)    Headache(784.0)    gets headache with menstrual period   History of UTI 06/25/2015   Hypertension    Kidney infection    has had several   Kidney stones    Mitral valve prolapse    MVP (mitral valve prolapse)    PE (pulmonary embolism)    Polycystic ovary syndrome    PONV (postoperative nausea and vomiting)    Pregnant 06/25/2015    Pyelonephritis    Seizures (HCC) 05/2013   potassium was low, blood sugar was low;no further meds or seizure   Vaginal Pap smear, abnormal     Past Surgical History:  Procedure Laterality Date   DIAGNOSTIC LAPAROSCOPY     LAPAROSCOPIC BILATERAL SALPINGECTOMY Bilateral 04/29/2016   Procedure: LAPAROSCOPIC BILATERAL SALPINGECTOMY excision of hemorrhagic corpus ludeum cyst;  Surgeon: Vonn VEAR Inch, MD;  Location: [facility];  Service: Gynecology;  Laterality: Bilateral;   ORIF ANKLE FRACTURE Right 09/08/2013   Procedure: OPEN REDUCTION INTERNAL FIXATION (ORIF) RIGHT ANKLE FRACTURE;  Surgeon: Kay Ozell Cummins, MD;  Location: [facility];  Service: Orthopedics;  Laterality: Right;    Social History   Socioeconomic History   Marital status: Media planner    Spouse name: Not on file   Number of children: Not on file   Years of education: Not on file   Highest education level: Not on file  Occupational History   Occupation: convenient store   Tobacco Use   Smoking status: Former    Current packs/day: 1.00    Average packs/day: 1 pack/day for 15.0 years (15.0 ttl pk-yrs)    Types: Cigarettes   Smokeless tobacco: Never   Tobacco comments:    Quit May 2025  Vaping Use   Vaping status: Never Used  Substance and Sexual Activity   Alcohol use: Yes    Comment: once year   Drug use: Not Currently    Types: Marijuana    Comment: former use of marijuana ( a year ago)   Sexual activity: Not Currently    Birth control/protection: None  Other Topics Concern   Not on file  Social History Narrative   Lives in [place].    Social Drivers of Corporate investment banker Strain: Low Risk  (01/14/2024)   Received from [facility]   Overall Financial Resource Strain (CARDIA)    Difficulty of Paying Living Expenses: Not hard at all  Food Insecurity: No Food Insecurity (01/14/2024)   Received from [facility]   Hunger Vital Sign    Within the past 12 months, you worried that your food would  run out before you got the money to buy more.: Never true    Within the past 12 months, the food you bought just didn't last and you didn't have money to get more.: Never true  Transportation Needs: No Transportation Needs (01/14/2024)   Received from [facility] - Transportation    Lack of Transportation (Medical): No    Lack of Transportation (Non-Medical): No  Physical Activity: Insufficiently Active (01/14/2024)   Received from [facility]   Exercise Vital Sign    On average, how many days per week do you engage in moderate to strenuous exercise (like a brisk walk)?: 1 day    On average, how many minutes do you engage in exercise at this level?: 30 min  Stress: Stress Concern Present (01/14/2024)   Received from [facility]  Health Care   [facility] - Occupational Stress Questionnaire    Feeling of Stress : Very much  Social Connections: Moderately Integrated (01/14/2024)   Received from [facility]   Social Connection and Isolation Panel    In a typical week, how many times do you talk on the phone with family, friends, or neighbors?: Three times a week    How often do you get together with friends or relatives?: Three times a week    How often do you attend church or religious services?: More than 4 times per year    Do you belong to any clubs or organizations such as church groups, unions, fraternal or athletic groups, or school groups?: No    How often do you attend meetings of the clubs or organizations you belong to?: Never    Are you married, widowed, divorced, separated, never married, or living with a partner?: Married  Intimate Partner Violence: Not At Risk (01/14/2024)   Received from [facility]   Humiliation, Afraid, Rape, and Kick questionnaire    Within the last year, have you been afraid of your partner or ex-partner?: No    Within the last year, have you been humiliated or emotionally abused in other ways by your partner or  ex-partner?: No    Within the last year, have you been kicked, hit, slapped, or otherwise physically hurt by your partner or ex-partner?: No    Within the last year, have you been raped or forced to have any kind of sexual activity by your partner or ex-partner?: No    Outpatient Encounter Medications as of 06/05/2024  Medication Sig   albuterol  (VENTOLIN  HFA) 108 (90 Base) MCG/ACT inhaler Inhale 1-2 puffs into the lungs every 6 (six) hours as needed for wheezing or shortness of breath.   DULoxetine (CYMBALTA) 60 MG capsule Take 60 mg by mouth daily.   hydrOXYzine (VISTARIL) 25 MG capsule Take 25-50 mg by mouth at bedtime as needed.   SUBOXONE 8-2 MG FILM Place 1 Film under the tongue 3 (three) times daily.   SUMAtriptan  (IMITREX ) 25 MG tablet Take 1 tab prn, May repeat in 2 hours if headache persists or recurs.   Multiple Vitamin (MULTIVITAMIN) capsule Take 1 capsule by mouth daily. (Patient not taking: Reported on 06/05/2024)   naloxone [facility]) nasal spray 4 mg/0.1 mL Place 1 spray into the nose as directed. (Patient not taking: Reported on 06/05/2024)   polyethylene glycol (MIRALAX ) 17 g packet Take 17 g by mouth daily. (Patient not taking: Reported on 06/05/2024)   [DISCONTINUED] nicotine  (NICODERM CQ  - DOSED IN MG/24 HOURS) 14 mg/24hr patch Place 14 mg onto the skin daily. (Patient not taking: Reported on 06/05/2024)   [DISCONTINUED] ondansetron  (ZOFRAN ) 8 MG tablet Take 8 mg by mouth every 8 (eight) hours as needed. (Patient not taking: Reported on 06/05/2024)   No facility-administered encounter medications on file as of 06/05/2024.    Allergies  Allergen Reactions   Motrin [Ibuprofen] Other (See Comments)    Stomach upset   Tape Itching and Swelling   Keflex [Cephalexin] Rash   Penicillins Rash    Immediate rash, facial/tongue/throat swelling, SOB or lightheadedness with hypotension   Sulfa Antibiotics Rash    Pertinent ROS per HPI, otherwise unremarkable      Objective:  BP  96/67   Pulse 86   Temp (!) 97.2 F (36.2 C) (Temporal)   Ht 5' 2 (1.575 m)   Wt 198 lb (89.8  kg)   LMP 07/28/2023 (Approximate)   SpO2 96%   BMI 36.21 kg/m    Wt Readings from Last 3 Encounters:  06/05/24 198 lb (89.8 kg)  03/28/24 189 lb (85.7 kg)  01/06/24 134 lb 14.7 oz (61.2 kg)    Physical Exam Vitals reviewed.  Constitutional:      General: She is not in acute distress.    Appearance: She is obese.  HENT:     Head: Normocephalic and atraumatic.     Right Ear: Tympanic membrane, ear canal and external ear normal. There is no impacted cerumen.     Left Ear: Tympanic membrane, ear canal and external ear normal. There is no impacted cerumen.     Nose: Nose normal.     Mouth/Throat:     Mouth: Mucous membranes are moist.  Eyes:     General: No scleral icterus.    Extraocular Movements: Extraocular movements intact.     Conjunctiva/sclera: Conjunctivae normal.     Pupils: Pupils are equal, round, and reactive to light.  Neck:     Vascular: No carotid bruit.  Pulmonary:     Effort: Pulmonary effort is normal.     Breath sounds: Normal breath sounds.     Comments: O 2 L oxygen Abdominal:     General: Bowel sounds are normal.     Palpations: Abdomen is soft.  Musculoskeletal:        General: Normal range of motion.     Cervical back: Normal range of motion and neck supple. No rigidity or tenderness.     Right lower leg: No edema.     Left lower leg: No edema.  Lymphadenopathy:     Cervical: No cervical adenopathy.  Skin:    General: Skin is warm and dry.     Findings: No rash.  Neurological:     Mental Status: She is oriented to person, place, and time.  Psychiatric:        Attention and Perception: Attention normal.        Mood and Affect: Mood is anxious. Affect is tearful.        Speech: Speech normal.        Behavior: Behavior normal. Behavior is cooperative.        Thought Content: Thought content normal. Thought content does not include homicidal or  suicidal ideation. Thought content does not include homicidal or suicidal plan.        Cognition and Memory: Cognition and memory normal.        Judgment: Judgment normal.    Physical Exam      Results for orders placed or performed during the hospital encounter of 05/22/24  Pulmonary function test   Collection Time: 05/22/24  9:11 AM  Result Value Ref Range   FVC-Pre 3.04 L   FVC-%Pred-Pre 90 %   FVC-Post 2.83 L   FVC-%Pred-Post 84 %   FVC-%Change-Post -6 %   FEV1-Pre 1.99 L   FEV1-%Pred-Pre 73 %   FEV1-Post 1.88 L   FEV1-%Pred-Post 69 %   FEV1-%Change-Post -5 %   FEV6-Pre 2.93 L   FEV6-%Pred-Pre 89 %   FEV6-Post 2.76 L   FEV6-%Pred-Post 83 %   FEV6-%Change-Post -5 %   Pre FEV1/FVC ratio 66 %   FEV1FVC-%Pred-Pre 80 %   Post FEV1/FVC ratio 66 %   FEV1FVC-%Change-Post 1 %   Pre FEV6/FVC Ratio 97 %   FEV6FVC-%Pred-Pre 98 %   Post FEV6/FVC ratio 97 %   FEV6FVC-%Pred-Post 99 %  FEV6FVC-%Change-Post 0 %   FEF 25-75 Pre 1.05 L/sec   FEF2575-%Pred-Pre 36 %   FEF 25-75 Post 0.77 L/sec   FEF2575-%Pred-Post 26 %   FEF2575-%Change-Post -26 %   RV 1.60 L   RV % pred 105 %   TLC 4.61 L   TLC % pred 98 %   DLCO unc 10.31 ml/min/mmHg   DLCO unc % pred 51 %   DL/VA 7.51 ml/min/mmHg/L   DL/VA % pred 55 %       Pertinent labs & imaging results that were available during my care of the patient were reviewed by me and considered in my medical decision making.  Assessment & Plan:  Hawraa was seen today for establish care and migraine.  Diagnoses and all orders for this visit:  Encounter for general adult medical examination with abnormal findings -     CBC with Differential/Platelet -     CMP14+EGFR -     Lipid panel -     Thyroid  Panel With TSH -     Hepatitis C antibody -     HepB+HepC+HIV Panel  Opioid use disorder in remission -     Ambulatory referral to Psychiatry  Moderate episode of recurrent major depressive disorder (HCC) -     Thyroid  Panel With TSH -      Ambulatory referral to Psychiatry -     VITAMIN D  25 Hydroxy (Vit-D Deficiency, Fractures)  GAD (generalized anxiety disorder) -     Thyroid  Panel With TSH -     Ambulatory referral to Psychiatry -     VITAMIN D  25 Hydroxy (Vit-D Deficiency, Fractures)  Screening mammogram for breast cancer -     MM 3D SCREENING MAMMOGRAM BILATERAL BREAST  Irregular menstrual cycle -     CBC with Differential/Platelet -     Thyroid  Panel With TSH  Class 2 obesity with body mass index (BMI) of 36.0 to 36.9 in adult, unspecified obesity type, unspecified whether serious comorbidity present -     Lipid panel -     Thyroid  Panel With TSH -     VITAMIN D  25 Hydroxy (Vit-D Deficiency, Fractures) -     Bayer DCA Hb A1c Waived  Chronic migraine without aura with status migrainosus, not intractable -     SUMAtriptan  (IMITREX ) 25 MG tablet; Take 1 tab prn, May repeat in 2 hours if headache persists or recurs.  Immunization due -     Pneumococcal conjugate vaccine 20-valent (Prevnar 20) -     Tdap vaccine greater than or equal to 7yo IM  Encounter for immunization -     Flu vaccine trivalent PF, 6mos and older(Flulaval,Afluria,Fluarix,Fluzone)    Lauren Durham is a 43 year old Caucasian female seen today to establish care, no acute distress Assessment and Plan Assessment & Plan Migraine without aura, recurrent Recurrent migraines with increased frequency and severity, characterized by severe headaches with photophobia, phonophobia, and nausea. History of migraines since childhood, with prior diagnosis but no consistent treatment. - Prescribe Imitrex , take one tablet at onset of migraine, may repeat after two hours if needed.  Generalized anxiety disorder and depression Anxiety and depression managed with Cymbalta and hydroxyzine. Reports increased anxiety, panic attacks, and emotional distress related to personal circumstances. - Refer to psychiatry for further management of anxiety and  depression.  Insomnia Difficulty sleeping, waking frequently, and feeling unrested. Current medications include Cymbalta and hydroxyzine. - Refer to psychiatry for evaluation and management of insomnia.  Opioid use disorder, in remission, on medication-assisted  therapy Opioid use disorder in remission since January, managed with Suboxone. Engaged in addiction recovery program and undergoing regular drug testing. - Continue Suboxone as prescribed. - Continue engagement with addiction recovery program.  Chronic obstructive pulmonary disease (COPD) requiring supplemental oxygen with peripheral edema COPD requiring 2L supplemental oxygen. Reports peripheral edema and significant weight gain since February. Attempted to reduce oxygen use but experienced increased swelling. Pulmonologist managing COPD and oxygen therapy. - Continue 2L supplemental oxygen. - Follow up with pulmonology as scheduled.  Obesity Significant weight gain since February, contributing to difficulty breathing and physical activity limitations. Potential contributing factors include edema and COPD.  Allergic rhinitis Allergies with recent exacerbation, symptoms include nasal congestion and itchy throat. Currently taking over-the-counter Zyrtec.  Irregular menstrual cycles (perimenopausal transition) Irregular menstrual cycles suggestive of perimenopausal transition. Reports one menstrual period in the past year.  General Health Maintenance Due for mammogram and vaccinations. - Administer flu, pneumonia administered - Order mammogram.  Labs: CBC, CMP, lipid, TSH, A1c, hep B, hep C, HIV, vitamin D     Continue all other maintenance medications.  Follow up plan: Return in about 4 months (around 10/05/2024) for Chronic Diseases Management.   Continue healthy lifestyle choices, including diet (rich in fruits, vegetables, and lean proteins, and low in salt and simple carbohydrates) and exercise (at least 30 minutes of  moderate physical activity daily).  Educational handout given for  Migraine Headache A migraine headache is a very strong throbbing pain on one or both sides of your head. This type of headache can also cause other symptoms. It can last from 4 hours to 3 days. Talk with your doctor about what things may bring on (trigger) this condition. What are the causes? The exact cause of a migraine is not known. This condition may be brought on or caused by: Smoking. Medicines, such as: Medicine used to treat chest pain (nitroglycerin). Birth control pills. Estrogen. Some blood pressure medicines. Certain substances in some foods or drinks. Foods and drinks, such as: Cheese. Chocolate. Alcohol. Caffeine. Doing physical activity that is very hard. Other things that may trigger a migraine headache include: Periods. Pregnancy. Hunger. Stress. Getting too much or too little sleep. Weather changes. Feeling tired (fatigue). What increases the risk? Being 4-109 years old. Being female. Having a family history of migraine headaches. Being Caucasian. Having a mental health condition, such as being sad (depressed) or feeling worried or nervous (anxious). Being very overweight (obese). What are the signs or symptoms? A throbbing pain. This pain may: Happen in any area of the head, such as on one or both sides. Make it hard to do daily activities. Get worse with physical activity. Get worse around bright lights, loud noises, or smells. Other symptoms may include: Feeling like you may vomit (nauseous). Vomiting. Dizziness. Before a migraine headache starts, you may get warning signs (an aura). An aura may include: Seeing flashing lights or having blind spots. Seeing bright spots, halos, or zigzag lines. Having tunnel vision or blurred vision. Having numbness or a tingling feeling. Having trouble talking. Having weak muscles. After a migraine ends, you may have symptoms. These may  include: Tiredness. Trouble thinking (concentrating). How is this treated? Taking medicines that: Relieve pain. Relieve the feeling like you may vomit. Prevent migraine headaches. Treatment may also include: Acupuncture. Lifestyle changes like avoiding foods that bring on migraine headaches. Learning ways to control your body functions (biofeedback). Therapy to help you know and deal with negative thoughts (cognitive behavioral therapy). Follow these instructions  at home: Medicines Take over-the-counter and prescription medicines only as told by your doctor. If told, take steps to prevent problems with pooping (constipation). You may need to: Drink enough fluid to keep your pee (urine) pale yellow. Take medicines. You will be told what medicines to take. Eat foods that are high in fiber. These include beans, whole grains, and fresh fruits and vegetables. Limit foods that are high in fat and sugar. These include fried or sweet foods. Ask your doctor if you should avoid driving or using machines while you are taking your medicine. Lifestyle  Do not drink alcohol. Do not smoke or use any products that contain nicotine  or tobacco. If you need help quitting, ask your doctor. Get 7-9 hours of sleep each night, or the amount recommended by your doctor. Find ways to deal with stress, such as meditation, deep breathing, or yoga. Try to exercise often. This can help lessen how bad and how often your migraines happen. General instructions Keep a journal to find out what may bring on your migraine headaches. This can help you avoid those things. For example, write down: What you eat and drink. How much sleep you get. Any change to your medicines or diet. If you have a migraine headache: Avoid things that make your symptoms worse, such as bright lights. Lie down in a dark, quiet room. Do not drive or use machinery. Ask your doctor what activities are safe for you. Where to find more  information Coalition for Headache and Migraine Patients (CHAMP): headachemigraine.org American Migraine Foundation: americanmigrainefoundation.org National Headache Foundation: headaches.org Contact a doctor if: You get a migraine headache that is different or worse than others you have had. You have more than 15 days of headaches in one month. Get help right away if: Your migraine headache gets very bad. Your migraine headache lasts more than 72 hours. You have a fever or stiff neck. You have trouble seeing. Your muscles feel weak or like you cannot control them. You lose your balance a lot. You have trouble walking. You faint. You have a seizure. This information is not intended to replace advice given to you by your health care provider. Make sure you discuss any questions you have with your health care provider. Document Revised: 04/27/2022 Document Reviewed: 04/27/2022 Elsevier Patient Education  2024 Else[employer] Inc. BMI for Adults Body mass index (BMI) is a number found using a person's weight and height. BMI can help tell how much of a person's weight is made up of fat. BMI does not measure body fat directly. It is used instead of tests that directly measure body fat, which can be difficult and expensive. What are BMI measurements used for? BMI is useful to: Find out if your weight puts you at higher risk for medical problems. Help recommend changes, such as in diet and exercise. This can help you reach a healthy weight. BMI screening can be done again to see if these changes are working. How is BMI calculated? Your height and weight are measured. The BMI is found from those numbers. This can be done with U[place] or metric measurements. Note that charts and online BMI calculators are available to help you find your BMI quickly and easily without doing these calculations. To calculate your BMI in [place] measurements: Measure your weight in pounds (lb). Multiply the number of pounds by  703. So, for an adult who weighs 150 lb, multiply that number by 703: 150 x 703, which equals 105,450. Measure your height in inches.  Then multiply that number by itself to get a measurement called inches squared. So, for an adult who is 70 inches tall, the inches squared measurement is 70 inches x 70 inches, which equals 4,900 inches squared. Divide the total from step 2 (number of lb x 703) by the total from step 3 (inches squared): 105,450  4,900 = 21.5. This is your BMI. To calculate your BMI in metric measurements:  Measure your weight in kilograms (kg). For this example, the weight is 70 kg. Measure your height in meters (m). Then multiply that number by itself to get a measurement called meters squared. So, for an adult who is 1.75 m tall, the meters squared measurement is 1.75 m x 1.75 m, which equals 3.1 meters squared. Divide the number of kilograms (your weight) by the meters squared number. In this example: 70  3.1 = 22.6. This is your BMI. What do the results mean? BMI charts are used to see if you are underweight, normal weight, overweight, or obese. The following guidelines will be used: Underweight: BMI less than 18.5. Normal weight: BMI between 18.5 and 24.9. Overweight: BMI between 25 and 29.9. Obese: BMI of 30 or above. BMI is a tool and cannot diagnose a condition. Talk with your health care provider about what your BMI means for you. Keep these notes in mind: Weight includes fat and muscle. Someone with a muscular build, such as an athlete, may have a BMI that is higher than 24.9. In cases like these, BMI is not a correct measure of body fat. If you have a BMI of 25 or higher, your provider may need to do more testing to find out if excess body fat is the cause. BMI is measured the same way for males and females. Females usually have more body fat than males of the same height and weight. Where to find more information For more information about BMI, including  tools to quickly find your BMI, go to: Centers for Disease Control and Prevention: TonerPromos.no American Heart Association: heart.org National Heart, Lung, and Blood Institute: BuffaloDryCleaner.gl This information is not intended to replace advice given to you by your health care provider. Make sure you discuss any questions you have with your health care provider. Document Revised: 05/21/2022 Document Reviewed: 05/14/2022 Elsevier Patient Education  2024 Elsevier Inc. Obesity, Adult Obesity is the condition of having too much total body fat. Being overweight or obese means that your weight is greater than what is considered healthy for your body size. Obesity is determined by a measurement called BMI (body mass index). BMI is an estimate of body fat and is calculated from height and weight. For adults, a BMI of 30 or higher is considered obese. Obesity can lead to other health concerns and major illnesses, including: Stroke. Coronary artery disease (CAD). Type 2 diabetes. Some types of cancer, including cancers of the colon, breast, uterus, and gallbladder. High blood pressure (hypertension). High cholesterol. Gallbladder stones. Obesity can also contribute to: Osteoarthritis. Sleep apnea. Infertility problems. What are the causes? Common causes of this condition include: Eating daily meals that are high in calories, sugar, and fat. Drinking high amounts of sugar-sweetened beverages, such as soft drinks. Being born with genes that may make you more likely to become obese. Having a medical condition that causes obesity, including: Hypothyroidism. Polycystic ovarian syndrome (PCOS). Binge-eating disorder. Cushing syndrome. Taking certain medicines, such as steroids, antidepressants, and seizure medicines. Not being physically active (sedentary lifestyle). Not getting enough sleep. What increases the  risk? The following factors may make you more likely to develop this condition: Having a  family history of obesity. Living in an area with limited access to: [place], recreation centers, or sidewalks. Healthy food choices, such as grocery stores and farmers' markets. What are the signs or symptoms? The main sign of this condition is having too much body fat. How is this diagnosed? This condition is diagnosed based on: Your BMI. If you are an adult with a BMI of 30 or higher, you are considered obese. Your waist circumference. This measures the distance around your waistline. Your skinfold thickness. Your health care provider may gently pinch a fold of your skin and measure it. You may have other tests to check for underlying conditions. How is this treated? Treatment for this condition often includes changing your lifestyle. Treatment may include some or all of the following: Dietary changes. This may include developing a healthy meal plan. Regular physical activity. This may include activity that causes your heart to beat faster (aerobic exercise) and strength training. Work with your health care provider to design an exercise program that works for you. Medicine to help you lose weight if you are unable to lose one pound a week after six weeks of healthy eating and more physical activity. Treating conditions that cause the obesity (underlying conditions). Surgery. Surgical options may include gastric banding and gastric bypass. Surgery may be done if: Other treatments have not helped to improve your condition. You have a BMI of 40 or higher. You have life-threatening health problems related to obesity. Follow these instructions at home: Eating and drinking  Follow recommendations from your health care provider about what you eat and drink. Your health care provider may advise you to: Limit fast food, sweets, and processed snack foods. Choose low-fat options, such as low-fat milk instead of whole milk. Eat five or more servings of fruits or vegetables every day. Choose  healthy foods when you eat out. Keep low-fat snacks available. Limit sugary drinks, such as soda, fruit juice, sweetened iced tea, and flavored milk. Drink enough water to keep your urine pale yellow. Do not follow a fad diet. Fad diets can be unhealthy and even dangerous. Other healthful choices include: Eat at home more often. This gives you more control over what you eat. Learn to read food labels. This will help you understand how much food is considered one serving. Learn what a healthy serving size is. Physical activity Exercise regularly, as told by your health care provider. Most adults should get up to 150 minutes of moderate-intensity exercise every week. Ask your health care provider what types of exercise are safe for you and how often you should exercise. Warm up and stretch before being active. Cool down and stretch after being active. Rest between periods of activity. Lifestyle Work with your health care provider and a dietitian to set a weight-loss goal that is healthy and reasonable for you. Limit your screen time. Find ways to reward yourself that do not involve food. Do not drink alcohol if: Your health care provider tells you not to drink. You are pregnant, may be pregnant, or are planning to become pregnant. If you drink alcohol: Limit how much you have to: 0-1 drink a day for women. 0-2 drinks a day for men. Know how much alcohol is in your drink. In the [place] one drink equals one 12 oz bottle of beer (355 mL), one 5 oz glass of wine (148 mL), or one 1 oz glass of  hard liquor (44 mL). General instructions Keep a weight-loss journal to keep track of the food you eat and how much exercise you get. Take over-the-counter and prescription medicines only as told by your health care provider. Take vitamins and supplements only as told by your health care provider. Consider joining a support group. Your health care provider may be able to recommend a support group. Pay  attention to your mental health as obesity can lead to depression or self esteem issues. Keep all follow-up visits. This is important. Contact a health care provider if: You are unable to meet your weight-loss goal after six weeks of dietary and lifestyle changes. You have trouble breathing. Summary Obesity is the condition of having too much total body fat. Being overweight or obese means that your weight is greater than what is considered healthy for your body size. Work with your health care provider and a dietitian to set a weight-loss goal that is healthy and reasonable for you. Exercise regularly, as told by your health care provider. Ask your health care provider what types of exercise are safe for you and how often you should exercise. This information is not intended to replace advice given to you by your health care provider. Make sure you discuss any questions you have with your health care provider. Document Revised: 04/08/2021 Document Reviewed: 04/08/2021 Elsevier Patient Education  2024 Elsevier Inc. Health Maintenance, Female Adopting a healthy lifestyle and getting preventive care are important in promoting health and wellness. Ask your health care provider about: The right schedule for you to have regular tests and exams. Things you can do on your own to prevent diseases and keep yourself healthy. What should I know about diet, weight, and exercise? Eat a healthy diet  Eat a diet that includes plenty of vegetables, fruits, low-fat dairy products, and lean protein. Do not eat a lot of foods that are high in solid fats, added sugars, or sodium. Maintain a healthy weight Body mass index (BMI) is used to identify weight problems. It estimates body fat based on height and weight. Your health care provider can help determine your BMI and help you achieve or maintain a healthy weight. Get regular exercise Get regular exercise. This is one of the most important things you can do  for your health. Most adults should: Exercise for at least 150 minutes each week. The exercise should increase your heart rate and make you sweat (moderate-intensity exercise). Do strengthening exercises at least twice a week. This is in addition to the moderate-intensity exercise. Spend less time sitting. Even light physical activity can be beneficial. Watch cholesterol and blood lipids Have your blood tested for lipids and cholesterol at 43 years of age, then have this test every 5 years. Have your cholesterol levels checked more often if: Your lipid or cholesterol levels are high. You are older than 43 years of age. You are at high risk for heart disease. What should I know about cancer screening? Depending on your health history and family history, you may need to have cancer screening at various ages. This may include screening for: Breast cancer. Cervical cancer. Colorectal cancer. Skin cancer. Lung cancer. What should I know about heart disease, diabetes, and high blood pressure? Blood pressure and heart disease High blood pressure causes heart disease and increases the risk of stroke. This is more likely to develop in people who have high blood pressure readings or are overweight. Have your blood pressure checked: Every 3-5 years if you are 18-39  years of age. Every year if you are 29 years old or older. Diabetes Have regular diabetes screenings. This checks your fasting blood sugar level. Have the screening done: Once every three years after age 34 if you are at a normal weight and have a low risk for diabetes. More often and at a younger age if you are overweight or have a high risk for diabetes. What should I know about preventing infection? Hepatitis B If you have a higher risk for hepatitis B, you should be screened for this virus. Talk with your health care provider to find out if you are at risk for hepatitis B infection. Hepatitis C Testing is recommended for: Everyone  born from [birth year] through 1965. Anyone with known risk factors for hepatitis C. Sexually transmitted infections (STIs) Get screened for STIs, including gonorrhea and chlamydia, if: You are sexually active and are younger than 43 years of age. You are older than 43 years of age and your health care provider tells you that you are at risk for this type of infection. Your sexual activity has changed since you were last screened, and you are at increased risk for chlamydia or gonorrhea. Ask your health care provider if you are at risk. Ask your health care provider about whether you are at high risk for HIV. Your health care provider may recommend a prescription medicine to help prevent HIV infection. If you choose to take medicine to prevent HIV, you should first get tested for HIV. You should then be tested every 3 months for as long as you are taking the medicine. Pregnancy If you are about to stop having your period (premenopausal) and you may become pregnant, seek counseling before you get pregnant. Take 400 to 800 micrograms (mcg) of folic acid every day if you become pregnant. Ask for birth control (contraception) if you want to prevent pregnancy. Osteoporosis and menopause Osteoporosis is a disease in which the bones lose minerals and strength with aging. This can result in bone fractures. If you are 56 years old or older, or if you are at risk for osteoporosis and fractures, ask your health care provider if you should: Be screened for bone loss. Take a calcium or vitamin D  supplement to lower your risk of fractures. Be given hormone replacement therapy (HRT) to treat symptoms of menopause. Follow these instructions at home: Alcohol use Do not drink alcohol if: Your health care provider tells you not to drink. You are pregnant, may be pregnant, or are planning to become pregnant. If you drink alcohol: Limit how much you have to: 0-1 drink a day. Know how much alcohol is in your drink. In  the [place] one drink equals one 12 oz bottle of beer (355 mL), one 5 oz glass of wine (148 mL), or one 1 oz glass of hard liquor (44 mL). Lifestyle Do not use any products that contain nicotine  or tobacco. These products include cigarettes, chewing tobacco, and vaping devices, such as e-cigarettes. If you need help quitting, ask your health care provider. Do not use street drugs. Do not share needles. Ask your health care provider for help if you need support or information about quitting drugs. General instructions Schedule regular health, dental, and eye exams. Stay current with your vaccines. Tell your health care provider if: You often feel depressed. You have ever been abused or do not feel safe at home. Summary Adopting a healthy lifestyle and getting preventive care are important in promoting health and wellness. Follow your health  care provider's instructions about healthy diet, exercising, and getting tested or screened for diseases. Follow your health care provider's instructions on monitoring your cholesterol and blood pressure. This information is not intended to replace advice given to you by your health care provider. Make sure you discuss any questions you have with your health care provider. Document Revised: 01/20/2021 Document Reviewed: 01/20/2021 Elsevier Patient Education  2024 Elsevier Inc. Mindfulness-Based Stress Reduction: What to Know Mindfulness-based stress reduction (MBSR) is a mindfulness meditation program that normally takes place over 8 weeks. It usually includes weekly group classes and daily exercises to do at home. What are the benefits of MBSR? Mindfulness meditation therapies, like MBSR, can change a person's brain and body in good ways, and make them healthier. MBSR can have many benefits, such as: Helping to lower stress hormones. Decreasing symptoms or helping to deal with symptoms of different conditions, like: Anxiety, which is feeling worried or  nervous. Long-lasting pain. This is pain that lasts more than 3 months. Stress and worry. Trouble sleeping. Headaches, like migraines and tension headaches. Irritable bowel syndrome. Helping to handle stress from things you can't control, like: Long-term illnesses, especially if you have a lot of pain or other difficult symptoms. Big life events. Stress at work. Stress from taking care of someone else. Types of MBSR exercises Mindfulness. This is a common type of meditation. Meditation. It helps you focus your mind to feel calm and happy. It has two main parts: paying attention and accepting. Paying attention means focusing on what is happening right now. This usually means noticing your breathing, your thoughts, how your body feels, and your emotions. Accepting means noticing these feelings and sensations without judging them. Instead of reacting to these thoughts or feelings, you just observe them and let them pass. MSBR exercises include: Body scanning. This is a mindfulness exercise where you pay attention to how different parts of your body feel. You can do this while lying down or sitting up. Sitting meditations. In this exercise, you focus on something like your breathing. When your mind starts to wander, gently bring it back to your breath. Keep doing this every time you notice your mind wandering. Mindful movements. This exercise involves moving and stretching your body slowly while paying attention to how it feels. Mindful Tasks. This means paying attention to how your body feels while doing things like walking or eating. Follow these instructions at home:  Find an in-person MBSR program or find a program that is online. Find a podcast or recording that provides guidance for MSBR exercises. Look for a therapist who knows how to use MBSR. Follow your treatment plan as told by your health care provider. This may include taking regular medicines and making changes to your diet or  lifestyle. Where to find more information You can find more information about MBSR from: Your provider. Community-based meditation centers or programs. American Psychological Association at http://forbes-duran.com/. This information is not intended to replace advice given to you by your health care provider. Make sure you discuss any questions you have with your health care provider. Document Revised: 11/04/2023 Document Reviewed: 11/04/2023 Elsevier Patient Education  2025 Elsevier Inc. Generalized Anxiety Disorder, Adult Generalized anxiety disorder (GAD) is a mental health condition. Unlike normal worries, anxiety related to GAD is not triggered by a specific event. These worries do not fade or get better with time. GAD interferes with relationships, work, and school. GAD symptoms can vary from mild to severe. People with severe GAD can have  intense waves of anxiety with physical symptoms that are similar to panic attacks. What are the causes? The exact cause of GAD is not known, but the following are believed to have an impact: Differences in natural brain chemicals. Genes passed down from parents to children. Differences in the way threats are perceived. Development and stress during childhood. Personality. What increases the risk? The following factors may make you more likely to develop this condition: Being female. Having a family history of anxiety disorders. Being very shy. Experiencing very stressful life events, such as the death of a loved one. Having a very stressful family environment. What are the signs or symptoms? People with GAD often worry excessively about many things in their lives, such as their health and family. Symptoms may also include: Mental and emotional symptoms: Worrying excessively about natural disasters. Fear of being late. Difficulty concentrating. Fears that others are judging your performance. Physical  symptoms: Fatigue. Headaches, muscle tension, muscle twitches, trembling, or feeling shaky. Feeling like your heart is pounding or beating very fast. Feeling out of breath or like you cannot take a deep breath. Having trouble falling asleep or staying asleep, or experiencing restlessness. Sweating. Nausea, diarrhea, or irritable bowel syndrome (IBS). Behavioral symptoms: Experiencing erratic moods or irritability. Avoidance of new situations. Avoidance of people. Extreme difficulty making decisions. How is this diagnosed? This condition is diagnosed based on your symptoms and medical history. You will also have a physical exam. Your health care provider may perform tests to rule out other possible causes of your symptoms. To be diagnosed with GAD, a person must have anxiety that: Is out of his or her control. Affects several different aspects of his or her life, such as work and relationships. Causes distress that makes him or her unable to take part in normal activities. Includes at least three symptoms of GAD, such as restlessness, fatigue, trouble concentrating, irritability, muscle tension, or sleep problems. Before your health care provider can confirm a diagnosis of GAD, these symptoms must be present more days than they are not, and they must last for 6 months or longer. How is this treated? This condition may be treated with: Medicine. Antidepressant medicine is usually prescribed for long-term daily control. Anti-anxiety medicines may be added in severe cases, especially when panic attacks occur. Talk therapy (psychotherapy). Certain types of talk therapy can be helpful in treating GAD by providing support, education, and guidance. Options include: Cognitive behavioral therapy (CBT). People learn coping skills and self-calming techniques to ease their physical symptoms. They learn to identify unrealistic thoughts and behaviors and to replace them with more appropriate thoughts and  behaviors. Acceptance and commitment therapy (ACT). This treatment teaches people how to be mindful as a way to cope with unwanted thoughts and feelings. Biofeedback. This process trains you to manage your body's response (physiological response) through breathing techniques and relaxation methods. You will work with a therapist while machines are used to monitor your physical symptoms. Stress management techniques. These include yoga, meditation, and exercise. A mental health specialist can help determine which treatment is best for you. Some people see improvement with one type of therapy. However, other people require a combination of therapies. Follow these instructions at home: Lifestyle Maintain a consistent routine and schedule. Anticipate stressful situations. Create a plan and allow extra time to work with your plan. Practice stress management or self-calming techniques that you have learned from your therapist or your health care provider. Exercise regularly and spend time outdoors. Eat a healthy  diet that includes plenty of vegetables, fruits, whole grains, low-fat dairy products, and lean protein. Do not eat a lot of foods that are high in fat, added sugar, or salt (sodium). Drink plenty of water. Avoid alcohol. Alcohol can increase anxiety. Avoid caffeine and certain over-the-counter cold medicines. These may make you feel worse. Ask your pharmacist which medicines to avoid. General instructions Take over-the-counter and prescription medicines only as told by your health care provider. Understand that you are likely to have setbacks. Accept this and be kind to yourself as you persist to take better care of yourself. Anticipate stressful situations. Create a plan and allow extra time to work with your plan. Recognize and accept your accomplishments, even if you judge them as small. Spend time with people who care about you. Keep all follow-up visits. This is important. Where to  find more information [employer]: http://www.maynard.net/ Substance Abuse and Mental Health Services: SkateOasis.com.pt Contact a health care provider if: Your symptoms do not get better. Your symptoms get worse. You have signs of depression, such as: A persistently sad or irritable mood. Loss of enjoyment in activities that used to bring you joy. Change in weight or eating. Changes in sleeping habits. Get help right away if: You have thoughts about hurting yourself or others. If you ever feel like you may hurt yourself or others, or have thoughts about taking your own life, get help right away. Go to your nearest emergency department or: Call your local emergency services (911 in the [place] Call a suicide crisis helpline, such as the National Suicide Prevention Lifeline at 248-821-7543 or 988 in the [place] This is open 24 hours a day in the [place] If you're a Veteran: Call 988 and press 1. This is open 24 hours a day. Text the [employer] at 9410330699. Summary Generalized anxiety disorder (GAD) is a mental health condition that involves worry that is not triggered by a specific event. People with GAD often worry excessively about many things in their lives, such as their health and family. GAD may cause symptoms such as restlessness, trouble concentrating, sleep problems, frequent sweating, nausea, diarrhea, headaches, and trembling or muscle twitching. A mental health specialist can help determine which treatment is best for you. Some people see improvement with one type of therapy. However, other people require a combination of therapies. This information is not intended to replace advice given to you by your health care provider. Make sure you discuss any questions you have with your health care provider. Document Revised: 04/15/2023 Document Reviewed: 12/22/2020 Elsevier Patient Education  2024 Elsevier Inc. Depression with the Seasons (Seasonal Affective  Disorder): How to Manage Seasonal Affective Disorder (SAD) is a kind of depression. It makes you feel depressed or sad at certain times of the year. Usually, SAD symptoms get better on their own when the seasons change. But, treatment can help you feel better faster, especially if the symptoms are really bad. What actions can I take to manage the condition? If you think you are having SAD symptoms, talk to your health care provider about your symptoms. Your provider may want to make sure something else isn't causing your symptoms, like thyroid  problems or an infection. Then, you can work with your provider or a mental health expert to make a plan that will work for you to help manage your symptoms.  Light therapy Treatment for SAD may include: Light therapy or trying to be exposed to more light (for fall or winter  SAD), like: Using a dawn simulator or sunrise clock. This is a timer-activated light source that copies the sunrise by slowly becoming brighter. Using a light box designed specifically to treat SAD. This involves sitting in front of a special light each day. Making your home and workplace bright and sunny. Like, opening the window blinds or moving your furniture closer to the windows to get more natural light. Trying to spend time outside if you can. But, remember to talk to your provider about the good and bad things about being in the sun. Other treatments Treatment may also include: Doing cognitive behavioral therapy (CBT). CBT is a form of talk therapy that helps to identify and change negative thoughts that are associated with SAD. Doing things to get or stay healthy and feel good. For example: Trying to get enough sleep every night. Exercising regularly. Eating healthy foods, including plenty of fruits and vegetables. Try to limit foods that contain a lot of fat or sugar. Staying busy or connected with other people. This may include: Helping others, Joining group  activities. Spending time with friends and family. Managing stress by finding and doing activities that help you relax, like reading, journaling, or spending time with a friend. Taking medicine, like an antidepressant. How to recognize changes in your condition As your SAD symptoms improve, you may: Start to feel your mood get better. Have more energy for daily activities. Think more clearly. Be less irritable or quick to be angry. Be interested again in things you usually enjoy. Follow these instructions at home Medicines Take your medicines only as told. Talk to your provider before you start taking any new prescription medicines, medicines you can buy at the store, herbs, or supplements. Talk with your pharmacist or provider about: All the medicines that you take. Possible side effects. What medicines are safe to take together. If you take medicine for SAD, avoid using alcohol and other substances because they may prevent your medicine from working correctly. Find out which side effects or symptoms are so serious you should call your provider. General instructions Be aware of your symptoms. Ask when you should expect for your symptoms to get better. Antidepressants take a while to start working. Keep all follow-up visits. Your provider will need to monitor your symptoms and treatment response. Where to find support You can also talk with: A therapist. Your provider. Friends and family. In-person or online support groups for people with SAD, like online support groups at www.https://harris-spencer.com/. Where to find more information To learn more, go to these websites: American Psychological Association: TropicalCloud.ca American Psychiatric Association: psychiatry.[employer] of Mental Health: BloggerCourse.com Then: Click the search box. Type Seasonal Affective Disorder in the search box and find the link you need. Contact a health care provider if: Your symptoms get worse or don't  get better. You have trouble taking care of yourself. You are using alcohol or other substances to manage your symptoms. You have side effects from medicines. Get help right away if: You feel like you may hurt yourself or others. You have thoughts about taking your own life. You have other thoughts or feelings that worry you. These symptoms may be an emergency. Take one of these steps right away: Go to your nearest emergency room. Call 911. Contact the 988 Suicide & Crisis Lifeline (24/7, free and confidential): Call or text 988. Chat online at chat.NewsActor.se. For Veterans and their loved ones: Call 988 and press 1. Text the [employer] at (770)078-0518. Chat online  at ReservationsList.si. This information is not intended to replace advice given to you by your health care provider. Make sure you discuss any questions you have with your health care provider. Document Revised: 11/16/2023 Document Reviewed: 11/16/2023 Elsevier Patient Education  2025 [employer].  The above assessment and management plan was discussed with the patient. The patient verbalized understanding of and has agreed to the management plan. Patient is aware to call the clinic if they develop any new symptoms or if symptoms persist or worsen. Patient is aware when to return to the clinic for a follow-up visit. Patient educated on when it is appropriate to go to the emergency department.  Karine Garn [place], DNP [facility] [address] [place], [place] [address] [phone]

## 2024-06-03 ENCOUNTER — Encounter: Payer: Self-pay | Admitting: Internal Medicine

## 2024-06-05 ENCOUNTER — Encounter: Payer: Self-pay | Admitting: Nurse Practitioner

## 2024-06-05 ENCOUNTER — Ambulatory Visit: Payer: Self-pay | Admitting: Nurse Practitioner

## 2024-06-05 VITALS — BP 96/67 | HR 86 | Temp 97.2°F | Ht 62.0 in | Wt 198.0 lb

## 2024-06-05 DIAGNOSIS — F1191 Opioid use, unspecified, in remission: Secondary | ICD-10-CM

## 2024-06-05 DIAGNOSIS — F411 Generalized anxiety disorder: Secondary | ICD-10-CM | POA: Diagnosis not present

## 2024-06-05 DIAGNOSIS — Z23 Encounter for immunization: Secondary | ICD-10-CM | POA: Diagnosis not present

## 2024-06-05 DIAGNOSIS — Z0001 Encounter for general adult medical examination with abnormal findings: Secondary | ICD-10-CM

## 2024-06-05 DIAGNOSIS — G43701 Chronic migraine without aura, not intractable, with status migrainosus: Secondary | ICD-10-CM

## 2024-06-05 DIAGNOSIS — F331 Major depressive disorder, recurrent, moderate: Secondary | ICD-10-CM

## 2024-06-05 DIAGNOSIS — Z1231 Encounter for screening mammogram for malignant neoplasm of breast: Secondary | ICD-10-CM | POA: Insufficient documentation

## 2024-06-05 DIAGNOSIS — N926 Irregular menstruation, unspecified: Secondary | ICD-10-CM

## 2024-06-05 DIAGNOSIS — E66812 Obesity, class 2: Secondary | ICD-10-CM

## 2024-06-05 DIAGNOSIS — Z6836 Body mass index (BMI) 36.0-36.9, adult: Secondary | ICD-10-CM

## 2024-06-05 LAB — BAYER DCA HB A1C WAIVED: HB A1C (BAYER DCA - WAIVED): 5.3 % (ref 4.8–5.6)

## 2024-06-05 MED ORDER — SUMATRIPTAN SUCCINATE 25 MG PO TABS: ORAL_TABLET | ORAL | 0 refills | Status: DC

## 2024-06-05 NOTE — Telephone Encounter (Signed)
 Called and spoke with pt  She reports feeling some better but still has some increased SOB and although sats above 90% they have been lower than usual  I scheduled her to see MW tomorrow at 1:15 and advised to seek emergent care sooner if needed Pt verbalized understanding

## 2024-06-06 ENCOUNTER — Encounter: Payer: Self-pay | Admitting: Internal Medicine

## 2024-06-06 ENCOUNTER — Ambulatory Visit: Payer: Self-pay | Admitting: Nurse Practitioner

## 2024-06-06 ENCOUNTER — Other Ambulatory Visit: Payer: Self-pay | Admitting: Nurse Practitioner

## 2024-06-06 ENCOUNTER — Ambulatory Visit: Admitting: Internal Medicine

## 2024-06-06 VITALS — BP 126/84 | HR 86 | Ht 62.0 in | Wt 198.8 lb

## 2024-06-06 DIAGNOSIS — E782 Mixed hyperlipidemia: Secondary | ICD-10-CM | POA: Insufficient documentation

## 2024-06-06 DIAGNOSIS — E559 Vitamin D deficiency, unspecified: Secondary | ICD-10-CM | POA: Insufficient documentation

## 2024-06-06 DIAGNOSIS — J449 Chronic obstructive pulmonary disease, unspecified: Secondary | ICD-10-CM | POA: Diagnosis not present

## 2024-06-06 MED ORDER — VITAMIN D (ERGOCALCIFEROL) 1.25 MG (50000 UNIT) PO CAPS: 50000.0000 [IU] | ORAL_CAPSULE | ORAL | 0 refills | Status: AC

## 2024-06-06 MED ORDER — PRAVASTATIN SODIUM 10 MG PO TABS: 10.0000 mg | ORAL_TABLET | Freq: Every day | ORAL | 0 refills | Status: AC

## 2024-06-06 MED ORDER — BREZTRI AEROSPHERE 160-9-4.8 MCG/ACT IN AERO: 2.0000 | INHALATION_SPRAY | Freq: Two times a day (BID) | RESPIRATORY_TRACT | Status: AC

## 2024-06-06 MED ORDER — ALBUTEROL SULFATE HFA 108 (90 BASE) MCG/ACT IN AERS: INHALATION_SPRAY | RESPIRATORY_TRACT | 11 refills | Status: AC

## 2024-06-06 MED ORDER — ALBUTEROL SULFATE (2.5 MG/3ML) 0.083% IN NEBU: 2.5000 mg | INHALATION_SOLUTION | RESPIRATORY_TRACT | 12 refills | Status: AC | PRN

## 2024-06-06 MED ORDER — BUDESONIDE-FORMOTEROL FUMARATE 80-4.5 MCG/ACT IN AERO: INHALATION_SPRAY | RESPIRATORY_TRACT | 12 refills | Status: DC

## 2024-06-06 NOTE — Progress Notes (Unsigned)
 Lauren Durham, female    DOB: 1981-06-08    MRN: [id number]   Brief patient profile:  43   yowf  quit smoking mid May 2025  referred to pulmonary clinic in [place]  03/28/2024 by [facility] hospitalists  for post hop f/u    Discharge date:                                 Jan 16, 2024 Length of stay:                                   LOS: 2 days    Discharge Service:                           [facility] Discharge Attending Physician:     Joana Marice Hurst, PA Discharge to:                                     To Home Condition at Discharge:                   good     Mental Status On day of Discharge:  The patient is Alert and oriented to PERSON The patient is Alert And oriented to TIME The patient is Alert and oriented to [facility] course based on timeline of significant events after admission (by date): 01/14/2024: Patient rates her breathing challenges as a 10 on a scale of 0-10 when she was admitted.  This morning on rounds, she is only improved to 8 on a scale of 0-10.  She is unable to talk in complete sentences and using accessory muscles of her chest and neck to take in deep breaths.  She gets short of breath with movement in her bed.  Patient requires oxygen and is saturating in the low to mid 90s   01/15/2024: On morning rounds today, patient continues to use chest and neck accessory muscles to breathe.  She is benefiting from DuoNebs every 4 hours which we will continue.  She has improved some and her breathing discomfort/challenges are rated 5 on a scale of 0-10.  Yesterday she was 8 and on admission she was at 10.  However, she will need to spend tonight with us  because she still has rales and rhonchi on her lung exam as well as shortness of breath.  She is requiring 3 L and oxygenating between 93 to 95%    ______________________________________     Admission HPI    Patient admitted on: 01/13/2024 10:51 PM  Patient admitted by: [name], DO   CHIEF COMPLAINT: Short of breath   Day of admission HPI:    This is a 43 y.o. female with a known history of MVP, PE after pregnancy (10 yrs ago), opioid use disorder on Suboxone presents to the emergency department for evaluation of  one month history of shortness of breath, chills.  Was taking OTC allergy medications without relief.  Was seen in [facility] ER one week ago, was diagnosed with COPD and discharged home.  Symptoms worsened over the past week including cough, congestionto the point where she was coughing so badly in her shower that she got dizzy, fell and hit her head, no  LOC.  She also reports tightness in her neck and upper back associated with coughing.   Of note quit smoking one week ago.    Otherwise there has been no change in status. Patient has been taking medication as prescribed and there has been no recent change in medication or diet. There has been no recent illness/hospitalizations, travel or sick contacts.  Patient denies weakness, dizziness, chest pain,  N/V/C/D, abdominal pain, dysuria/frequency, changes in mental status.  In the ED the patient received azactam, levaquin, methylpred, duoneb.   Medical admission was requested for further workup and management of Multifocal pneumonia.    Patient admitted on Home O2? - No Patient on home anticoagulant? -  No Patient admitted with Chronic home foley catheter? - No Foley catheter placed or replaced by another service prior to admission? - No   Mental Status on Admission: The patient is Alert and oriented to PERSON The patient is Alert And oriented to TIME The patient is Alert and oriented to LOCATION     Problem List, Assessment & Plan     ASSESSMENT & PLAN (In order of descending acuity)   43 y.o. y.o. female with a known history of MVP, PE after pregnancy (10 yrs ago), opioid use disorder on Suboxone now admitted with   Multifocal pneumonia - Admit IP - O2 nebs expectorants PRN - Continue Levaquin -  Check sputum culture and resp path panel   COPD - Continue methylpred - Will need to establish with outpatient pulm   History of tobacco use, quit one week ago - Nicotine  patch - Cessation advised   History of opioid use disorder in remission - Hold any oral opiates - Pain control with NSAIDs - Resume suboxone   Admission status: IP IV Fluids: LR Diet/Nutrition: Regular Consults: PT DVT Px:  SCDs and early ambulation Code Status: Full Code Disposition Plan: To home in 1-2 days   ADDITIONAL PATIENT FINDINGS OR OBSERVATIONS   Patient will be discharged today on the following medications: Levaquin 750 mg daily x 10 days Over-the-counter Robitussin DM Prednisone  20 mg x 5 days DuoNeb every 6 hours as needed for COPD exacerbation/shortness of breath Nicotine  patch x 1 month Doxycycline 100 mg twice daily x 10 days Case management will set up pulmonology appointment   Labs from 01/16/2024 BMP = within normal limits except glucose is elevated 212.  Patient is getting steroids. CBC shows WBC = 13.3 (was 12.5 yesterday).  Patient is getting steroids.   Patient is clinically stable to be discharged home. On exam, patient is not using any accessory muscles to breathe. She is not in any respiratory distress. Lung sounds are clear.   DVT prophylaxis while in hospital:             pneumatic compression device       History of Present Illness  03/28/2024  Pulmonary/ 1st office eval/ Lauren Durham / [facility] Office  Chief Complaint  Patient presents with   Pulmonary Consult    Referred by Joana Hurst, PA. Pt hospitalized for pna end of May 2025. She c/o DOE since then and states has to sleep sitting up. She has recently had some swelling in her legs.   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 She has prod cough with clear sputum.    Dyspnea:  doe x > slow adls  Cough: mostly mucoid / worse in am's  Sleeping: poorly due to cough /wheeze     SABA use: ran out x 2 days  02: none    No obvious day to day or daytime variability or assoc  purulent sputum or mucus plugs or hemoptysis or cp or chest tightness, subjective wheeze or overt   hb symptoms.    Also denies any obvious fluctuation of symptoms with weather or environmental changes or other aggravating or alleviating factors except as outlined above   No unusual exposure hx or h/o childhood pna/ asthma or knowledge of premature birth.  Current Allergies, Complete Past Medical History, Past Surgical History, Family History, and Social History were reviewed in [employer] record.  ROS  The following are not active complaints unless bolded Hoarseness, sore throat, dysphagia, dental problems, itching, sneezing,  nasal congestion or discharge of excess mucus or purulent secretions, ear ache,   fever, chills, sweats, unintended wt loss or wt gain, classically pleuritic or exertional cp,  orthopnea pnd or arm/hand swelling  or leg swelling, presyncope, palpitations, abdominal pain, anorexia, nausea, vomiting, diarrhea  or change in bowel habits or change in bladder habits, change in stools or change in urine, dysuria, hematuria,  rash, arthralgias, visual complaints, headache, numbness, weakness or ataxia or problems with walking or coordination,  change in mood or  memory.        Current Meds  Medication Sig   albuterol  (VENTOLIN  HFA) 108 (90 Base) MCG/ACT inhaler Inhale 1-2 puffs into the lungs every 6 (six) hours as needed for wheezing or shortness of breath.   DULoxetine (CYMBALTA) 60 MG capsule Take 60 mg by mouth daily.   hydrOXYzine (VISTARIL) 25 MG  capsule Take 25-50 mg by mouth at bedtime as needed.   Multiple Vitamin (MULTIVITAMIN) capsule Take 1 capsule by mouth daily.   naloxone (NARCAN) nasal spray 4 mg/0.1 mL Place 1 spray into the nose as directed.   polyethylene glycol (MIRALAX ) 17 g packet Take 17 g by mouth daily.   SUBOXONE 8-2 MG FILM Place 1 Film under the tongue 3 (three) times daily.   SUMAtriptan  (IMITREX ) 25 MG tablet Take 1 tab prn, May repeat in 2 hours if headache persists or recurs.            Past Medical History:  Diagnosis Date   Anemia    low iron, anemic as a child   Anxiety    not treated at present time   Bradycardia    Cervical dysplasia    Depression    not treated at present time   GERD (gastroesophageal reflux disease)    Headache(784.0)    gets headache with menstrual period   History of UTI 06/25/2015   Hypertension    Kidney infection    has had several   Kidney stones    Mitral valve prolapse    MVP (mitral valve prolapse)    PE (pulmonary embolism)    Polycystic ovary syndrome    PONV (postoperative nausea and vomiting)    Pregnant 06/25/2015   Pyelonephritis    Seizures (HCC) 05/2013   potassium was low, blood sugar was low;no further meds or seizure   Vaginal Pap  smear, abnormal       Objective:    Wts   06/06/2024        198   06/05/24 198 lb (89.8 kg)  03/28/24 189 lb (85.7 kg)  01/06/24 134 lb 14.7 oz (61.2 kg)      Vital signs reviewed  06/06/2024  - Note at rest 02 sats  96% on RA   General appearance:    amb wf harsh dry sounding cough    HEENT : Oropharynx  no upper, poor remaining lower teeth  Nasal turbinates nl    NECK :  without  apparent JVD/ palpable Nodes/TM    LUNGS: no acc muscle use,  Min barrel  contour chest wall with bilateral insp/exp rhonchi  and  without cough on insp or exp maneuvers and min  Hyperresonant  to  percussion bilaterally    CV:  RRR  no s3 or murmur or increase in P2, and no edema   ABD:  soft and nontender    MS:  Nl  gait/ ext warm without deformities Or obvious joint restrictions  calf tenderness, cyanosis or clubbing     SKIN: warm and dry without lesions    NEURO:  alert, approp, nl sensorium with  no motor or cerebellar deficits apparent.       Assessment   Assessment & Plan COPD mixed type [facility]) Quit smoking May 2025 with GOLD 2 criteria but AB pattern - PFT's  05/22/24  FEV1 1.99 (73 % ) ratio 0.66  p 0 % improvement from saba p ?0 prior to study with DLCO  10.3 (51%)   and FV curve mildly concave  06/06/2024  After extensive coaching inhaler device,  effectiveness =    60% short ti with hfa > use up breztr sample and then start symb 80 2bid  - Allergy screen 06/06/2024 >  Eos 0. /  IgE  and alpha one AT phenotype  She is having a mild aecopd despite reporting complete smoking abstinence so good candidate for breztri  but cough is worse than sob and medicaid doesn't cover breztri  s prior symbicort  try/ fail so rx as above       Each maintenance medication was reviewed in detail including emphasizing most importantly the difference between maintenance and prns and under what circumstances the prns are to be triggered using an action plan format (ABC below) where appropriate.  Total time for H and P, chart review, counseling, reviewing hfa /neb device(s) and generating customized AVS unique to this office visit / same day charting = 34 min          AVS  Patient Instructions  Plan A = Automatic = Always=    Symbicort  80 (Breztri ) Take 2 puffs first thing in am and then another 2 puffs about 12 hours later.     Plan B = Backup (to supplement plan A, not to replace it) Use your albuterol  inhaler as a rescue medication to be used if you can't catch your breath by resting or slowing your pace  or doing a relaxed purse lip breathing pattern.  - The less you use it, the better it will work when you need it. - Ok to use the inhaler up to 2 puffs  every 4 hours if you must but call for appointment if use  goes up over your usual need - Don't leave home without it !!  (think of it like the spare tire or starter fluid for your car)   Plan  C = Crisis (instead of Plan B but only if Plan B stops working) - only use your albuterol  nebulizer if you first try Plan B and it fails to help > ok to use the nebulizer up to every 4 hours but if start needing it regularly call for immediate appointment  Please remember to go to the lab department   for your tests - we will call you with the results when they are available.   Please schedule a follow up visit in 3 months but call sooner if needed    Ozell America, MD 06/07/2024

## 2024-06-06 NOTE — Patient Instructions (Addendum)
 Plan A = Automatic = Always=    Symbicort  80 (Breztri ) Take 2 puffs first thing in am and then another 2 puffs about 12 hours later.     Plan B = Backup (to supplement plan A, not to replace it) Use your albuterol  inhaler as a rescue medication to be used if you can't catch your breath by resting or slowing your pace  or doing a relaxed purse lip breathing pattern.  - The less you use it, the better it will work when you need it. - Ok to use the inhaler up to 2 puffs  every 4 hours if you must but call for appointment if use goes up over your usual need - Don't leave home without it !!  (think of it like the spare tire or starter fluid for your car)   Plan C = Crisis (instead of Plan B but only if Plan B stops working) - only use your albuterol  nebulizer if you first try Plan B and it fails to help > ok to use the nebulizer up to every 4 hours but if start needing it regularly call for immediate appointment  Please remember to go to the lab department   for your tests - we will call you with the results when they are available.   Please schedule a follow up visit in 3 months but call sooner if needed

## 2024-06-07 ENCOUNTER — Inpatient Hospital Stay: Admission: RE | Admit: 2024-06-07 | Source: Ambulatory Visit

## 2024-06-07 LAB — HEPB+HEPC+HIV PANEL
HIV Screen 4th Generation wRfx: NONREACTIVE
Hep B C IgM: NEGATIVE
Hep B Core Total Ab: NEGATIVE
Hep B E Ab: NONREACTIVE
Hep B E Ag: NEGATIVE
Hep B Surface Ab, Qual: NONREACTIVE
Hep C Virus Ab: NONREACTIVE
Hepatitis B Surface Ag: NEGATIVE

## 2024-06-07 LAB — CBC WITH DIFFERENTIAL/PLATELET
Basophils Absolute: 0.1 x10E3/uL (ref 0.0–0.2)
Basos: 1 %
EOS (ABSOLUTE): 0.2 x10E3/uL (ref 0.0–0.4)
Eos: 3 %
Hematocrit: 42.5 % (ref 34.0–46.6)
Hemoglobin: 14.4 g/dL (ref 11.1–15.9)
Immature Grans (Abs): 0 x10E3/uL (ref 0.0–0.1)
Immature Granulocytes: 0 %
Lymphocytes Absolute: 2 x10E3/uL (ref 0.7–3.1)
Lymphs: 28 %
MCH: 31.4 pg (ref 26.6–33.0)
MCHC: 33.9 g/dL (ref 31.5–35.7)
MCV: 93 fL (ref 79–97)
Monocytes Absolute: 0.6 x10E3/uL (ref 0.1–0.9)
Monocytes: 8 %
Neutrophils Absolute: 4.5 x10E3/uL (ref 1.4–7.0)
Neutrophils: 60 %
Platelets: 193 x10E3/uL (ref 150–450)
RBC: 4.59 x10E6/uL (ref 3.77–5.28)
RDW: 12.3 % (ref 11.7–15.4)
WBC: 7.4 x10E3/uL (ref 3.4–10.8)

## 2024-06-07 LAB — THYROID PANEL WITH TSH
Free Thyroxine Index: 1.7 (ref 1.2–4.9)
T3 Uptake Ratio: 23 % — ABNORMAL LOW (ref 24–39)
T4, Total: 7.5 ug/dL (ref 4.5–12.0)
TSH: 2.39 u[IU]/mL (ref 0.450–4.500)

## 2024-06-07 LAB — LIPID PANEL
Chol/HDL Ratio: 4.9 ratio — ABNORMAL HIGH (ref 0.0–4.4)
Cholesterol, Total: 197 mg/dL (ref 100–199)
HDL: 40 mg/dL (ref >39)
LDL Chol Calc (NIH): 124 mg/dL — ABNORMAL HIGH (ref 0–99)
Triglycerides: 186 mg/dL — ABNORMAL HIGH (ref 0–149)
VLDL Cholesterol Cal: 33 mg/dL (ref 5–40)

## 2024-06-07 LAB — CMP14+EGFR
ALT: 23 IU/L (ref 0–32)
AST: 18 IU/L (ref 0–40)
Albumin: 4.3 g/dL (ref 3.9–4.9)
Alkaline Phosphatase: 110 IU/L (ref 41–116)
BUN/Creatinine Ratio: 11 (ref 9–23)
BUN: 8 mg/dL (ref 6–24)
Bilirubin Total: <0.2 mg/dL (ref 0.0–1.2)
CO2: 21 mmol/L (ref 20–29)
Calcium: 9.3 mg/dL (ref 8.7–10.2)
Chloride: 104 mmol/L (ref 96–106)
Creatinine, Ser: 0.76 mg/dL (ref 0.57–1.00)
Globulin, Total: 2.6 g/dL (ref 1.5–4.5)
Glucose: 89 mg/dL (ref 70–99)
Potassium: 4.1 mmol/L (ref 3.5–5.2)
Sodium: 140 mmol/L (ref 134–144)
Total Protein: 6.9 g/dL (ref 6.0–8.5)
eGFR: 100 mL/min/1.73 (ref >59)

## 2024-06-07 LAB — VITAMIN D 25 HYDROXY (VIT D DEFICIENCY, FRACTURES): Vit D, 25-Hydroxy: 19.1 ng/mL — ABNORMAL LOW (ref 30.0–100.0)

## 2024-06-07 NOTE — Assessment & Plan Note (Addendum)
 Quit smoking May 2025 with GOLD 2 criteria but AB pattern - PFT's  05/22/24  FEV1 1.99 (73 % ) ratio 0.66  p 0 % improvement from saba p ?0 prior to study with DLCO  10.3 (51%)   and FV curve mildly concave  06/06/2024  After extensive coaching inhaler device,  effectiveness =    60% short ti with hfa > use up breztr sample and then start symb 80 2bid  - Allergy screen 06/06/2024 >  Eos 0. /  IgE  and alpha one AT phenotype  She is having a mild aecopd despite reporting complete smoking abstinence so good candidate for breztri  but cough is worse than sob and medicaid doesn't cover breztri  s prior symbicort  try/ fail so rx as above       Each maintenance medication was reviewed in detail including emphasizing most importantly the difference between maintenance and prns and under what circumstances the prns are to be triggered using an action plan format (ABC below) where appropriate.  Total time for H and P, chart review, counseling, reviewing hfa /neb device(s) and generating customized AVS unique to this office visit / same day charting = 34 min

## 2024-06-12 ENCOUNTER — Ambulatory Visit: Payer: Self-pay | Admitting: Internal Medicine

## 2024-06-12 LAB — ALPHA-1-ANTITRYPSIN PHENOTYP: A-1 Antitrypsin: 146 mg/dL (ref 101–187)

## 2024-06-12 LAB — IGE: IgE (Immunoglobulin E), Serum: 81 [IU]/mL (ref 6–495)

## 2024-06-13 NOTE — Progress Notes (Signed)
 Atc x1 lmtcb

## 2024-06-14 NOTE — Progress Notes (Signed)
 Called and spoke with pt regarding her result ,  pt confirmed understanding

## 2024-06-22 ENCOUNTER — Ambulatory Visit: Admitting: Internal Medicine

## 2024-06-26 ENCOUNTER — Encounter

## 2024-06-26 ENCOUNTER — Other Ambulatory Visit: Payer: Self-pay | Admitting: Internal Medicine

## 2024-06-26 DIAGNOSIS — J449 Chronic obstructive pulmonary disease, unspecified: Secondary | ICD-10-CM

## 2024-08-18 ENCOUNTER — Other Ambulatory Visit: Payer: Self-pay | Admitting: Nurse Practitioner

## 2024-08-18 ENCOUNTER — Other Ambulatory Visit: Payer: Self-pay | Admitting: Internal Medicine

## 2024-08-18 DIAGNOSIS — G43701 Chronic migraine without aura, not intractable, with status migrainosus: Secondary | ICD-10-CM

## 2024-08-18 DIAGNOSIS — J449 Chronic obstructive pulmonary disease, unspecified: Secondary | ICD-10-CM

## 2024-09-12 ENCOUNTER — Ambulatory Visit: Admitting: Internal Medicine

## 2024-09-12 DIAGNOSIS — J449 Chronic obstructive pulmonary disease, unspecified: Secondary | ICD-10-CM

## 2024-09-12 NOTE — Progress Notes (Deleted)
 "   Lauren Durham, female    DOB: 06-01-1981    MRN: [id number]   Brief patient profile:  43   yowf  quit smoking mid May 2025/MM   referred to pulmonary clinic in [place]  03/28/2024 by [facility] hospitalists  for post hop f/u    Discharge date:                                 Jan 16, 2024 Length of stay:                                   LOS: 2 days    Discharge Service:                           [facility] Hospitalists Discharge Attending Physician:     Joana Marice Hurst, PA Discharge to:                                     To Home Condition at Discharge:                   good     Mental Status On day of Discharge:  The patient is Alert and oriented to PERSON The patient is Alert And oriented to TIME The patient is Alert and oriented to [facility] course based on timeline of significant events after admission (by date): 01/14/2024: Patient rates her breathing challenges as a 10 on a scale of 0-10 when she was admitted.  This morning on rounds, she is only improved to 8 on a scale of 0-10.  She is unable to talk in complete sentences and using accessory muscles of her chest and neck to take in deep breaths.  She gets short of breath with movement in her bed.  Patient requires oxygen and is saturating in the low to mid 90s   01/15/2024: On morning rounds today, patient continues to use chest and neck accessory muscles to breathe.  She is benefiting from DuoNebs every 4 hours which we will continue.  She has improved some and her breathing discomfort/challenges are rated 5 on a scale of 0-10.  Yesterday she was 8 and on admission she was at 10.  However, she will need to spend tonight with us  because she still has rales and rhonchi on her lung exam as well as shortness of breath.  She is requiring 3 L and oxygenating between 93 to 95%    ______________________________________     Admission HPI    Patient admitted on: 01/13/2024 10:51 PM  Patient admitted by: Lauren Roger, DO   CHIEF COMPLAINT: Short of breath   Day of admission HPI:    This is a 43 y.o. female with a known history of MVP, PE after pregnancy (10 yrs ago), opioid use disorder on Suboxone presents to the emergency department for evaluation of  one month history of shortness of breath, chills.  Was taking OTC allergy medications without relief.  Was seen in [facility] ER one week ago, was diagnosed with COPD and discharged home.  Symptoms worsened over the past week including cough, congestionto the point where she was coughing so badly in her shower that she got dizzy, fell and hit  her head, no LOC.  She also reports tightness in her neck and upper back associated with coughing.   Of note quit smoking one week ago.    Otherwise there has been no change in status. Patient has been taking medication as prescribed and there has been no recent change in medication or diet. There has been no recent illness/hospitalizations, travel or sick contacts.  Patient denies weakness, dizziness, chest pain,  N/V/C/D, abdominal pain, dysuria/frequency, changes in mental status.  In the ED the patient received azactam, levaquin, methylpred, duoneb.   Medical admission was requested for further workup and management of Multifocal pneumonia.    Patient admitted on Home O2? - No Patient on home anticoagulant? -  No Patient admitted with Chronic home foley catheter? - No Foley catheter placed or replaced by another service prior to admission? - No   Mental Status on Admission: The patient is Alert and oriented to PERSON The patient is Alert And oriented to TIME The patient is Alert and oriented to LOCATION     Problem List, Assessment & Plan     ASSESSMENT & PLAN (In order of descending acuity)   43 y.o. y.o. female with a known history of MVP, PE after pregnancy (10 yrs ago), opioid use disorder on Suboxone now admitted with   Multifocal pneumonia - Admit IP - O2 nebs expectorants PRN - Continue Levaquin -  Check sputum culture and resp path panel   COPD - Continue methylpred - Will need to establish with outpatient pulm   History of tobacco use, quit one week ago - Nicotine  patch - Cessation advised   History of opioid use disorder in remission - Hold any oral opiates - Pain control with NSAIDs - Resume suboxone   Admission status: IP IV Fluids: LR Diet/Nutrition: Regular Consults: PT DVT Px:  SCDs and early ambulation Code Status: Full Code Disposition Plan: To home in 1-2 days   ADDITIONAL PATIENT FINDINGS OR OBSERVATIONS   Patient will be discharged today on the following medications: Levaquin 750 mg daily x 10 days Over-the-counter Robitussin DM Prednisone  20 mg x 5 days DuoNeb every 6 hours as needed for COPD exacerbation/shortness of breath Nicotine  patch x 1 month Doxycycline 100 mg twice daily x 10 days Case management will set up pulmonology appointment   Labs from 01/16/2024 BMP = within normal limits except glucose is elevated 212.  Patient is getting steroids. CBC shows WBC = 13.3 (was 12.5 yesterday).  Patient is getting steroids.   Patient is clinically stable to be discharged home. On exam, patient is not using any accessory muscles to breathe. She is not in any respiratory distress. Lung sounds are clear.   DVT prophylaxis while in hospital:             pneumatic compression device       History of Present Illness  03/28/2024  Pulmonary/ 1st office eval/ Lowell Mcgurk / [facility] Office  Chief Complaint  Patient presents with   Pulmonary Consult    Referred by Joana Hurst, PA. Pt hospitalized for pna end of May 2025. She c/o DOE since then and states has to sleep sitting up. She has recently had some swelling in her legs.   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 She has prod cough with clear sputum.    Dyspnea:  doe x > slow adls  Cough: mostly mucoid / worse in am's  Sleeping: poorly due to cough /wheeze     SABA use: ran out x 2 days  02: none   Patient Instructions  Plan A = Automatic = Always=    Symbicort  80 (Breztri ) Take 2 puffs first thing in am and then another 2 puffs about 12 hours later.  Plan B = Backup (to supplement plan A, not to replace it) Use your albuterol  inhaler as a rescue medication   Plan C = Crisis (instead of Plan B but only if Plan B stops working) - only use your albuterol  nebulizer if you first try Plan B and it fails to help      Allergy screen 06/06/2024 >  Eos 0.2 /  IgE 81 and alpha one AT phenotype MM level 146      09/12/2024  f/u ov/ office/Rankin Coolman re: *** maint on ***  No chief complaint on file.   Dyspnea:  *** Cough: *** Sleeping: ***   resp cc  SABA use: *** 02: ***  Lung cancer screening: ***   No obvious day to day or daytime variability or assoc excess/ purulent sputum or mucus plugs or hemoptysis or cp or chest tightness, subjective wheeze or overt sinus or hb symptoms.    Also denies any obvious fluctuation of symptoms with weather or environmental changes or other aggravating or alleviating factors except as outlined above   No unusual exposure hx or h/o childhood pna/ asthma or knowledge of premature birth.  Current Allergies, Complete Past Medical History, Past Surgical History, Family History, and Social History were reviewed in [employer] record.  ROS  The following are not active complaints unless bolded Hoarseness, sore throat, dysphagia, dental problems, itching, sneezing,  nasal congestion or discharge of excess mucus or purulent secretions, ear ache,   fever, chills, sweats, unintended wt loss  or wt gain, classically pleuritic or exertional cp,  orthopnea pnd or arm/hand swelling  or leg swelling, presyncope, palpitations, abdominal pain, anorexia, nausea, vomiting, diarrhea  or change in bowel habits or change in bladder habits, change in stools or change in urine, dysuria, hematuria,  rash, arthralgias, visual complaints, headache, numbness, weakness or ataxia or problems with walking or coordination,  change in mood or  memory.         Outpatient Medications Prior to Visit  Medication Sig Dispense Refill   albuterol  (PROAIR  HFA) 108 (90 Base) MCG/ACT inhaler Up to 2 puffs every 4 hours as needed 1 each 11   albuterol  (PROVENTIL ) (2.5 MG/3ML) 0.083% nebulizer solution Take 3 mLs (2.5 mg total) by nebulization every 4 (four) hours as needed. 75 mL 12   albuterol  (VENTOLIN  HFA) 108 (90 Base) MCG/ACT inhaler Inhale 1-2 puffs into the lungs every 6 (six) hours as needed for wheezing or shortness of breath. 1 each 5   budesonide -formoterol  (SYMBICORT ) 80-4.5 MCG/ACT inhaler INHALE TWO PUFFS first thing EVERY MORNING and THEN INHALE TWO PUFFS 12 hours LATER 1 g 12   budesonide -glycopyrrolate -formoterol  (BREZTRI  AEROSPHERE) 160-9-4.8 MCG/ACT AERO inhaler Inhale 2 puffs into the lungs in the morning and at bedtime.     DULoxetine (CYMBALTA) 60  MG capsule Take 60 mg by mouth daily.     hydrOXYzine (VISTARIL) 25 MG capsule Take 25-50 mg by mouth at bedtime as needed.     Multiple Vitamin (MULTIVITAMIN) capsule Take 1 capsule by mouth daily.     naloxone (NARCAN) nasal spray 4 mg/0.1 mL Place 1 spray into the nose as directed.     polyethylene glycol (MIRALAX ) 17 g packet Take 17 g by mouth daily. 14 each 0   pravastatin  (PRAVACHOL ) 10 MG tablet Take 1 tablet (10 mg total) by mouth daily. 90 tablet 0   SUBOXONE 8-2 MG FILM Place 1 Film under the tongue 3 (three) times daily.     SUMAtriptan  (IMITREX ) 25 MG tablet TAKE 1 TABLET BY MOUTH AS NEEDED, MAY REPEAT in 2 HOURS if HEADACHE persists OR  recurs 10 tablet 0   Vitamin D , Ergocalciferol , (DRISDOL ) 1.25 MG (50000 UNIT) CAPS capsule Take 1 capsule (50,000 Units total) by mouth every 7 (seven) days. 12 capsule 0   No facility-administered medications prior to visit.       Past Medical History:  Diagnosis Date   Anemia    low iron, anemic as a child   Anxiety    not treated at present time   Bradycardia    Cervical dysplasia    Depression    not treated at present time   GERD (gastroesophageal reflux disease)    Headache(784.0)    gets headache with menstrual period   History of UTI 06/25/2015   Hypertension    Kidney infection    has had several   Kidney stones    Mitral valve prolapse    MVP (mitral valve prolapse)    PE (pulmonary embolism)    Polycystic ovary syndrome    PONV (postoperative nausea and vomiting)    Pregnant 06/25/2015   Pyelonephritis    Seizures (HCC) 05/2013   potassium was low, blood sugar was low;no further meds or seizure   Vaginal Pap smear, abnormal       Objective:    Wts  09/12/2024       ***  06/06/2024        198   06/05/24 198 lb (89.8 kg)  03/28/24 189 lb (85.7 kg)  01/06/24 134 lb 14.7 oz (61.2 kg)       Vital signs reviewed  09/12/2024  - Note at rest 02 sats  ***% on ***   General appearance:    ***    Min barre*** Assessment           "

## 2024-09-25 ENCOUNTER — Encounter: Payer: Self-pay | Admitting: *Deleted

## 2024-10-05 ENCOUNTER — Ambulatory Visit: Payer: Self-pay | Admitting: Nurse Practitioner

## 2024-10-10 ENCOUNTER — Telehealth: Payer: Self-pay | Admitting: Nurse Practitioner

## 2024-10-10 ENCOUNTER — Ambulatory Visit: Admitting: Nurse Practitioner

## 2024-10-10 ENCOUNTER — Ambulatory Visit: Payer: Self-pay

## 2024-10-10 NOTE — Telephone Encounter (Signed)
 FYI Only or Action Required?: FYI only for provider: appointment scheduled on 10/11/24.  Patient was last seen in primary care on 06/05/2024 by Deitra Morton Sebastian Nena, NP.  Called Nurse Triage reporting Hip Pain.  Symptoms began yesterday.  Interventions attempted: Nothing.  Symptoms are: unchanged.  Triage Disposition: See Physician Within 24 Hours  Patient/caregiver understands and will follow disposition?: Yes   Message from Deaijah H sent at 10/10/2024 11:26 AM EST  Reason for Triage: [facility] yesterday, hurt back/knee. Lumbar spinal stenosis has gotten worse as well can hardly get up. / Needs TOC to [employer].    Reason for Disposition  [1] Redness of the skin AND [2] no fever  Answer Assessment - Initial Assessment Questions Scheduled 10/11/24 Advised call back or ED/911 if symptoms occur/worsen: severe diff breathing, chest pain > 5 min, faint, increased swelling, redness, hot to touch, unable bear wt, loss b/b. Patient verbalized understanding.    1. LOCATION and RADIATION: Where is the pain located? Does the pain spread (shoot) anywhere else?     Back, hip, right knee; s/p fall yesterday on ic  Swelling; legs,thigh down, wrist to hands Chronic symptoms, denies redness, hot to touch  Cpap at night  2. QUALITY: What does the pain feel like?  (e.g., sharp, dull, aching, burning)     Comes and goes, throbbing 3. SEVERITY: How bad is the pain? What does it keep you from doing?   (Scale 1-10; or mild, moderate, severe)     7/10 with movement 4. ONSET: When did the pain start? Does it come and go, or is it there all the time?     yesterday 6. CAUSE: What do you think is causing the hip pain?      fall 7. AGGRAVATING FACTORS: What makes the hip pain worse? (e.g., walking, climbing stairs, running)     movement 8. OTHER SYMPTOMS: Do you have any other symptoms? (e.g., back pain, pain shooting down leg,  fever, rash)   swelling  Denies diff breath,  chest pain, faint, rash, weakness/ numbness, loss b/b, fever, chills,n/v, redness, hot to touch  Protocols used: Hip Pain-A-AH

## 2024-10-10 NOTE — Telephone Encounter (Signed)
 Apt scheduled.

## 2024-10-10 NOTE — Telephone Encounter (Signed)
 Called and left message to reschedule appt on 10/10/24 due to provider being out of the office

## 2024-10-11 ENCOUNTER — Ambulatory Visit: Admitting: Nurse Practitioner

## 2024-10-12 ENCOUNTER — Ambulatory Visit: Admitting: Family Medicine

## 2024-11-03 ENCOUNTER — Ambulatory Visit: Admitting: Internal Medicine
